# Patient Record
Sex: Male | Born: 1965 | Race: Black or African American | Hispanic: No | Marital: Married | Smoking: Former smoker
Health system: Southern US, Community
[De-identification: ages and names within clinical notes are randomized; demographics above are authoritative.]

## PROBLEM LIST (undated history)

## (undated) DIAGNOSIS — N289 Disorder of kidney and ureter, unspecified: Secondary | ICD-10-CM

## (undated) DIAGNOSIS — M1712 Unilateral primary osteoarthritis, left knee: Secondary | ICD-10-CM

## (undated) DIAGNOSIS — I10 Essential (primary) hypertension: Secondary | ICD-10-CM

## (undated) DIAGNOSIS — M1990 Unspecified osteoarthritis, unspecified site: Secondary | ICD-10-CM

## (undated) HISTORY — DX: Essential (primary) hypertension: I10

## (undated) HISTORY — PX: APPENDECTOMY: SHX54

---

## 1998-09-08 ENCOUNTER — Emergency Department (HOSPITAL_COMMUNITY): Admission: EM | Admit: 1998-09-08 | Discharge: 1998-09-08 | Payer: Self-pay

## 2002-05-21 ENCOUNTER — Encounter: Admission: RE | Admit: 2002-05-21 | Discharge: 2002-05-21 | Payer: Self-pay | Admitting: Internal Medicine

## 2002-05-21 ENCOUNTER — Encounter: Payer: Self-pay | Admitting: Internal Medicine

## 2002-05-22 ENCOUNTER — Encounter: Admission: RE | Admit: 2002-05-22 | Discharge: 2002-05-28 | Payer: Self-pay | Admitting: Internal Medicine

## 2005-08-19 ENCOUNTER — Inpatient Hospital Stay (HOSPITAL_COMMUNITY): Admission: EM | Admit: 2005-08-19 | Discharge: 2005-08-20 | Payer: Self-pay | Admitting: *Deleted

## 2005-08-19 ENCOUNTER — Encounter (INDEPENDENT_AMBULATORY_CARE_PROVIDER_SITE_OTHER): Payer: Self-pay | Admitting: *Deleted

## 2006-11-13 ENCOUNTER — Ambulatory Visit (HOSPITAL_COMMUNITY): Admission: RE | Admit: 2006-11-13 | Discharge: 2006-11-13 | Payer: Self-pay | Admitting: Neurosurgery

## 2007-01-19 ENCOUNTER — Emergency Department (HOSPITAL_COMMUNITY): Admission: EM | Admit: 2007-01-19 | Discharge: 2007-01-20 | Payer: Self-pay | Admitting: Emergency Medicine

## 2007-01-24 ENCOUNTER — Ambulatory Visit (HOSPITAL_COMMUNITY): Admission: RE | Admit: 2007-01-24 | Discharge: 2007-01-25 | Payer: Self-pay | Admitting: Neurosurgery

## 2007-11-29 HISTORY — PX: SHOULDER SURGERY: SHX246

## 2009-12-12 ENCOUNTER — Emergency Department (HOSPITAL_COMMUNITY): Admission: EM | Admit: 2009-12-12 | Discharge: 2009-12-12 | Payer: Self-pay | Admitting: Emergency Medicine

## 2010-02-17 ENCOUNTER — Encounter: Admission: RE | Admit: 2010-02-17 | Discharge: 2010-02-17 | Payer: Self-pay | Admitting: Family Medicine

## 2010-12-19 ENCOUNTER — Encounter: Payer: Self-pay | Admitting: Family Medicine

## 2011-02-13 LAB — BASIC METABOLIC PANEL
BUN: 13 mg/dL (ref 6–23)
CO2: 27 mEq/L (ref 19–32)
Calcium: 8.7 mg/dL (ref 8.4–10.5)
Chloride: 104 mEq/L (ref 96–112)
Creatinine, Ser: 1.14 mg/dL (ref 0.4–1.5)
GFR calc Af Amer: >60 mL/min (ref >60)
GFR calc non Af Amer: >60 mL/min (ref >60)
Glucose, Bld: 81 mg/dL (ref 70–99)
Potassium: 3.9 mEq/L (ref 3.5–5.1)
Sodium: 137 mEq/L (ref 135–145)

## 2011-02-13 LAB — DIFFERENTIAL
Basophils Absolute: 0 10*3/uL (ref 0.0–0.1)
Basophils Relative: 0 % (ref 0–1)
Eosinophils Absolute: 0.2 10*3/uL (ref 0.0–0.7)
Eosinophils Relative: 3 % (ref 0–5)
Lymphocytes Relative: 46 % (ref 12–46)
Lymphs Abs: 3.4 10*3/uL (ref 0.7–4.0)
Monocytes Absolute: 0.5 10*3/uL (ref 0.1–1.0)
Monocytes Relative: 7 % (ref 3–12)
Neutro Abs: 3.3 10*3/uL (ref 1.7–7.7)
Neutrophils Relative %: 44 % (ref 43–77)

## 2011-02-13 LAB — CBC
HCT: 40.2 % (ref 39.0–52.0)
Hemoglobin: 13.9 g/dL (ref 13.0–17.0)
MCHC: 34.4 g/dL (ref 30.0–36.0)
MCV: 90.8 fL (ref 78.0–100.0)
Platelets: 208 10*3/uL (ref 150–400)
RBC: 4.43 MIL/uL (ref 4.22–5.81)
RDW: 14.1 % (ref 11.5–15.5)
WBC: 7.4 10*3/uL (ref 4.0–10.5)

## 2011-02-13 LAB — CK TOTAL AND CKMB (NOT AT ARMC)
CK, MB: 3.1 ng/mL (ref 0.3–4.0)
Relative Index: 1 (ref 0.0–2.5)
Total CK: 316 U/L — ABNORMAL HIGH (ref 7–232)

## 2011-02-13 LAB — TROPONIN I: Troponin I: 0.01 ng/mL (ref 0.00–0.06)

## 2011-02-13 LAB — GLUCOSE, CAPILLARY: Glucose-Capillary: 90 mg/dL (ref 70–99)

## 2011-04-15 NOTE — Op Note (Signed)
NAMEFINTAN, Johnathan Gillespie NO.:  [id number]   MEDICAL RECORD NO.:  [id number]          PATIENT TYPE:  OIB   LOCATION:  3172                         FACILITY:  [facility]   PHYSICIAN:  Coletta Memos, M.D.     DATE OF BIRTH:  Aug 29, 1966   DATE OF PROCEDURE:  01/24/2007  DATE OF DISCHARGE:                               OPERATIVE REPORT   PREOPERATIVE DIAGNOSIS:  1. Left C6-C7 displaced disc.  2. Left C7 radiculopathy.   POSTOPERATIVE DIAGNOSIS:  1. Left C6-C7 displaced disc.  2. Left C7 radiculopathy.   PROCEDURE:  1. Anterior cervical decompression C6-C7.  2. Arthrodesis C6-C7 with 8 mm Synthes allograft.  3. Anterior instrumentation with Vector plating and 14 mm screws.   COMPLICATIONS:  None.   SURGEON:  Coletta Memos, M.D.   ASSISTANT:  Clydene Fake, M.D.   INDICATIONS:  Johnathan Gillespie is a 45 year old gentleman who presented in  December with significant pain in the left upper extremity.  A myelogram  and post myelogram CT showed a displaced disc at C6-C7 on the left side.  I, therefore, recommended and he agreed to undergo operative  decompression.   OPERATIVE NOTE:  Johnathan Gillespie was brought to the operating room,  intubated, and placed under a general anesthetic without difficulty.  He  was positioned with his head in a horseshoe headrest neutrally.  His  neck was prepped and draped in a sterile fashion.  I infiltrated 3 mL of  0.5% lidocaine with 1:200,000 epinephrine, starting from the midline  extending to the medial border of the left sternocleidomastoid below the  cricoid cartilage.  I opened the skin with a #10 blade.  I took this  down to the platysma.  I dissected rostrally and caudally in a plane  both above and below the platysma after I opened the platysma with  Metzenbaum scissors.  I then dissected a plane between the  sternocleidomastoid and medial strap muscles.  I went medial to the  omohyoid and the carotid artery, both of which were  retracted laterally.  I placed a spinal needle and this showed I was at C6-C7.  I then  reflected the longus colli muscles bilaterally and placed a self-  retaining retractor.  I opened the disc space at C6-C7 using a #15  blade.  I then, with the microscope in position, used a drill, pituitary  rongeurs, and curets to remove disc material endplate and also  osteophytes.  After thorough decompression of the spinal canal and both  C7 nerve roots, I then irrigated.  I then prepared for arthrodesis.   The arthrodesis was performed after preparing the endplates with a high  speed drill and curet.  I placed an 8 mm Synthes graft into position.  This was done with Dr. Doreen Beam assistance.  The graft fit snugly and  then I turned my attention to the instrumentation.  I removed the  distraction pins which had been placed at C6 and at C7.  I then sized  the plate and placed four screws with Dr. Doreen Beam assistance, first by  drilling and using self-tapping screws,  two in  C6 and two in C7.  X-ray showed the plate, plug and screws to be in good  position.  I then irrigated the wound.  I then closed the wound in a  layered fashion, reapproximating the platysma and subcuticular layer.  Dermabond was used for sterile dressing.           ______________________________  Coletta Memos, M.D.     KC/MEDQ  D:  01/24/2007  T:  01/24/2007  Job:  [id number]

## 2011-04-15 NOTE — Op Note (Signed)
Johnathan Gillespie, TATAR NO.:  [id number]   MEDICAL RECORD NO.:  [id number]          PATIENT TYPE:  INP   LOCATION:  1304                         FACILITY:  [facility]   PHYSICIAN:  Velora Heckler, MD      DATE OF BIRTH:  [birth year]   DATE OF PROCEDURE:  08/19/2005  DATE OF DISCHARGE:                                 OPERATIVE REPORT   PREOPERATIVE DIAGNOSIS:  Acute appendicitis.   POSTOPERATIVE DIAGNOSIS:  Acute appendicitis.   PROCEDURE:  Laparoscopic appendectomy.   SURGEON:  Velora Heckler, M.D.   ANESTHESIA:  General.   ESTIMATED BLOOD LOSS:  Minimal.   PREPARATION:  Betadine.   COMPLICATIONS:  None.   INDICATIONS:  The patient is a 45 year old black male from [place], [place],  who presents with a three-day history of abdominal pain localizing to the  right lower quadrant associated with nausea and vomiting.  The patient was  evaluated by Dr. Jackquline Denmark.  He was referred for surgical consultation to  [facility] Emergency Department.  Laboratory studies showed a white count  of 10.9.  Physical examination was worrisome for acute appendicitis.  A CT  scan of the abdomen and pelvis confirmed acute appendicitis.  The patient is  now brought to the operating room for appendectomy.   DESCRIPTION OF PROCEDURE:  The procedure was done in OR #1 at the [facility].  The patient is brought to the operating room,  placed in supine position on the operating room table. Following  administration of general anesthesia, the patient was prepped and draped in  usual strict aseptic fashion.  After ascertaining that an adequate level of  anesthesia had been obtained, a supraumbilical incision was made in the  midline with a #15 blade. Dissection was carried down to the fascia.  The  fascia is incised in the midline. The peritoneal cavity was entered  cautiously.  A 0 Vicryl pursestring suture was placed in the fascia.  A  Hasson cannula was introduced and  secured with a pursestring suture. Abdomen  was insufflated with carbon dioxide.  Laparoscope was introduced, and the  abdomen was explored.  Operative ports were placed in the right upper  quadrant and left lower quadrant.  Cecum was mobilized.  There was a tense,  distended, indurated appendix.  It was not ruptured.  There is no fluid.  There is no abscess.  Using gentle blunt dissection, the appendix was  dissected away from the terminal ileum.  Appendiceal mesentery was then  divided with the harmonic scalpel.  Dissection was carried down to the base  of the appendix.  Using the Endo-GIA stapler, the base of the appendix and  proximal proximal portion of the cecal wall was resected.  Appendix was  placed into an EndoCatch bag and withdrawn through the left lower quadrant  port without difficulty. Staple line was inspected for hemostasis.  Mesentery was inspected for hemostasis.  Ports were removed under direct  vision, and good hemostasis was noted at all port sites.  Pneumoperitoneum  was released and all ports were removed.  Pursestring  sutures tied securely.  Sites were anesthetized with local  anesthetic.  All wounds were closed with interrupted 4-0 Vicryl subcuticular  sutures.  Wounds were washed and dried and Steri-Strips were applied.  Sterile dressings were applied.  The patient is awakened from anesthesia and  brought to the recovery room in stable condition. The patient tolerated the  procedure well.      Velora Heckler, MD  Electronically Signed     TMG/MEDQ  D:  08/19/2005  T:  08/20/2005  Job:  [id number]   cc:   Renaye Rakers, M.D.  Fax: [phone]

## 2011-04-15 NOTE — Consult Note (Signed)
NAME:  Johnathan Gillespie, SCHREIER NO.:  [id number]   MEDICAL RECORD NO.:  [id number]           PATIENT TYPE:   LOCATION:                               FACILITY:  [facility]   PHYSICIAN:  Velora Heckler, MD      DATE OF BIRTH:  01/12/1966   DATE OF CONSULTATION:  DATE OF DISCHARGE:                                   CONSULTATION   CONSULTING PHYSICIAN:  Velora Heckler, M.D.   REFERRING PHYSICIAN:  Renaye Rakers, M.D.   CHIEF COMPLAINT:  Abdominal pain, rule out acute appendicitis.   HISTORY OF PRESENT ILLNESS:  Johnathan Gillespie is a pleasant, 45 year old,  black male from [place], [place].  He presents with his family to  the emergency department at the recommendation of Dr. Renaye Rakers for  evaluation for possible acute appendicitis.  The patient had a three-day  illness.  He felt that he had a cold.  However, he had diffuse abdominal  pain which gradually localized to the right lower quadrant.  This was  associated with nausea.  The patient was seen by Dr. Parke Simmers and physical exam  was worrisome for acute appendicitis.  He was referred to surgery for  assessment at [facility].   PAST MEDICAL HISTORY:  1.  Status post umbilical herniorrhaphy.  2.  Tonsillectomy as a child.  3.  History of hypertension.   MEDICATIONS:  Diovan.   ALLERGIES:  None known.   SOCIAL HISTORY:  The patient is married.  He is accompanied by his wife.  He  has two children.  He works for [employer].  He smokes a pack of cigarettes a  day.  He drinks alcohol socially.   REVIEW OF SYSTEMS:  A 15-system review without significant other findings.   FAMILY HISTORY:  Noncontributory.   PHYSICAL EXAMINATION:  GENERAL:  A 45 year old, well-developed, well-  nourished, black male in no acute distress.  VITAL SIGNS:  Temp 97.7, pulse 71, respirations 20, blood pressure 145/77.  HEENT:  Shows him to be normocephalic, atraumatic.  Sclerae clear.  Conjunctivae clear.  Pupils equal  and reactive.  Dentition good.  Mucous  membranes moist.  Voice is normal.  NECK:  Supple, nontender without mass.  Thyroid normal without nodularity.  There is no lymphadenopathy.  LUNGS:  Clear to auscultation bilaterally without rales, rhonchi, or wheeze.  CARDIAC:  Shows a regular rate and rhythm without murmur.  Peripheral pulses  are full.  ABDOMEN:  Soft without distention.  There are a few bowel sounds present.  There is tenderness to percussion and palpation in the right lower quadrant.  There is no referred tenderness.  There is mild rebound.  There is guarding.  There is no sign of hernia.  There is a well healed surgical wound at the  umbilicus.  There is no hepatosplenomegaly.  There is no upper abdominal  tenderness.  EXTREMITIES:  Nontender without edema.  NEUROLOGIC:  The patient is alert and oriented without focal deficit.   LABORATORY STUDIES:  White count 10.9 with normal differential, hemoglobin  14.4, platelet count 293,000.  Electrolytes are normal.  Prothrombin time  normal at 13.2.   RADIOGRAPHIC STUDIES:  CT scan abdomen and pelvis, reviewed with Dr.  Pecolia Ades, documenting early acute appendicitis.   IMPRESSION:  Acute appendicitis.   PLAN:  1.  Admission to [facility].  2.  Initiation of intravenous antibiotics.  3.  To operating room for appendectomy.  4.  Routine postoperative care.   I had a length discussion with the patient and his family regarding  appendectomy.  I explained laparoscopic technique versus open technique.  I  believe there is an approximately 90% chance we will be successful with  laparoscopic surgery on [name].  He understands the risks and benefits  of the procedure and wishes to proceed.  We will make arrangements with the  operating room immediately.      Velora Heckler, MD  Electronically Signed     TMG/MEDQ  D:  08/19/2005  T:  08/19/2005  Job:  [id number]   cc:   Renaye Rakers, M.D.  Fax: [phone]    Velora Heckler, MD  Fax: [phone]

## 2014-01-16 ENCOUNTER — Ambulatory Visit: Payer: Self-pay | Admitting: Emergency Medicine

## 2014-01-16 VITALS — BP 124/96 | HR 76 | Temp 98.2°F | Resp 18 | Ht 72.5 in | Wt 243.2 lb

## 2014-01-16 DIAGNOSIS — Z0289 Encounter for other administrative examinations: Secondary | ICD-10-CM

## 2014-01-16 NOTE — Progress Notes (Signed)
[facility] [address], [place] [id number] [phone]  Date:  01/16/2014   Name:  Johnathan CherryKevin L Riddles   DOB:  08-16-1966   MRN:  [id number]  PCP:  No primary provider on file.    Chief Complaint: Employment Physical   History of Present Illness:  Johnathan CherryKevin L Witherington is a 48 y.o. very pleasant male patient who presents with the following:  DOT certification.  On  diovan for HBP   There are no active problems to display for this patient.   Past Medical History  Diagnosis Date  . Hypertension     Past Surgical History  Procedure Laterality Date  . Shoulder surgery  2009    History  Substance Use Topics  . Smoking status: Former Games developermoker  . Smokeless tobacco: Not on file  . Alcohol Use: No    Family History  Problem Relation Age of Onset  . Hypertension Mother   . Hypertension Father     No Known Allergies  Medication list has been reviewed and updated.  No current outpatient prescriptions on file prior to visit.   No current facility-administered medications on file prior to visit.    Review of Systems:  As per HPI, otherwise negative.    Physical Examination: Filed Vitals:   01/16/14 1409  BP: 124/96  Pulse: 76  Temp: 98.2 F (36.8 C)  Resp: 18   Filed Vitals:   01/16/14 1409  Height: 6' 0.5" (1.842 m)  Weight: 243 lb 3.2 oz (110.315 kg)   Body mass index is 32.51 kg/(m^2). Ideal Body Weight: Weight in (lb) to have BMI = 25: 186.5  GEN: WDWN, NAD, Non-toxic, A & O x 3 HEENT: Atraumatic, Normocephalic. Neck supple. No masses, No LAD. Ears and Nose: No external deformity. CV: RRR, No M/G/R. No JVD. No thrill. No extra heart sounds. PULM: CTA B, no wheezes, crackles, rhonchi. No retractions. No resp. distress. No accessory muscle use. ABD: S, NT, ND, +BS. No rebound. No HSM. EXTR: No c/c/e NEURO Normal gait.  PSYCH: Normally interactive. Conversant. Not depressed or anxious appearing.  Calm demeanor.    Assessment  and Plan: DOT certi  Signed,  Phillips OdorJeffery Kaydin Labo, MD

## 2014-03-02 ENCOUNTER — Emergency Department (HOSPITAL_COMMUNITY)
Admission: EM | Admit: 2014-03-02 | Discharge: 2014-03-03 | Disposition: A | Discharge status: Home / Self Care | Payer: No Typology Code available for payment source | Attending: Emergency Medicine | Admitting: Emergency Medicine

## 2014-03-02 DIAGNOSIS — N200 Calculus of kidney: Secondary | ICD-10-CM | POA: Insufficient documentation

## 2014-03-02 DIAGNOSIS — Z87891 Personal history of nicotine dependence: Secondary | ICD-10-CM | POA: Insufficient documentation

## 2014-03-02 DIAGNOSIS — Z791 Long term (current) use of non-steroidal anti-inflammatories (NSAID): Secondary | ICD-10-CM | POA: Insufficient documentation

## 2014-03-02 DIAGNOSIS — N23 Unspecified renal colic: Secondary | ICD-10-CM | POA: Insufficient documentation

## 2014-03-02 DIAGNOSIS — I10 Essential (primary) hypertension: Secondary | ICD-10-CM | POA: Insufficient documentation

## 2014-03-02 DIAGNOSIS — Z79899 Other long term (current) drug therapy: Secondary | ICD-10-CM | POA: Insufficient documentation

## 2014-03-02 DIAGNOSIS — K7689 Other specified diseases of liver: Secondary | ICD-10-CM | POA: Insufficient documentation

## 2014-03-02 DIAGNOSIS — K760 Fatty (change of) liver, not elsewhere classified: Secondary | ICD-10-CM

## 2014-03-02 DIAGNOSIS — N289 Disorder of kidney and ureter, unspecified: Secondary | ICD-10-CM | POA: Insufficient documentation

## 2014-03-02 DIAGNOSIS — N201 Calculus of ureter: Secondary | ICD-10-CM | POA: Insufficient documentation

## 2014-03-02 HISTORY — DX: Disorder of kidney and ureter, unspecified: N28.9

## 2014-03-03 ENCOUNTER — Encounter (HOSPITAL_COMMUNITY): Payer: Self-pay | Admitting: Emergency Medicine

## 2014-03-03 ENCOUNTER — Emergency Department (HOSPITAL_COMMUNITY): Payer: No Typology Code available for payment source

## 2014-03-03 LAB — COMPREHENSIVE METABOLIC PANEL
ALT: 62 U/L — ABNORMAL HIGH (ref 0–53)
AST: 33 U/L (ref 0–37)
Albumin: 4.5 g/dL (ref 3.5–5.2)
Alkaline Phosphatase: 102 U/L (ref 39–117)
BUN: 20 mg/dL (ref 6–23)
CO2: 27 mEq/L (ref 19–32)
Calcium: 10.2 mg/dL (ref 8.4–10.5)
Chloride: 96 mEq/L (ref 96–112)
Creatinine, Ser: 1.54 mg/dL — ABNORMAL HIGH (ref 0.50–1.35)
GFR calc Af Amer: 60 mL/min — ABNORMAL LOW (ref >90)
GFR calc non Af Amer: 52 mL/min — ABNORMAL LOW (ref >90)
Glucose, Bld: 134 mg/dL — ABNORMAL HIGH (ref 70–99)
Potassium: 3.6 mEq/L — ABNORMAL LOW (ref 3.7–5.3)
Sodium: 139 mEq/L (ref 137–147)
Total Bilirubin: 0.6 mg/dL (ref 0.3–1.2)
Total Protein: 8.8 g/dL — ABNORMAL HIGH (ref 6.0–8.3)

## 2014-03-03 LAB — CBC WITH DIFFERENTIAL/PLATELET
Basophils Absolute: 0 10*3/uL (ref 0.0–0.1)
Basophils Relative: 0 % (ref 0–1)
Eosinophils Absolute: 0 10*3/uL (ref 0.0–0.7)
Eosinophils Relative: 0 % (ref 0–5)
HCT: 45.4 % (ref 39.0–52.0)
Hemoglobin: 15.7 g/dL (ref 13.0–17.0)
Lymphocytes Relative: 15 % (ref 12–46)
Lymphs Abs: 1.6 10*3/uL (ref 0.7–4.0)
MCH: 30.7 pg (ref 26.0–34.0)
MCHC: 34.6 g/dL (ref 30.0–36.0)
MCV: 88.8 fL (ref 78.0–100.0)
Monocytes Absolute: 0.4 10*3/uL (ref 0.1–1.0)
Monocytes Relative: 4 % (ref 3–12)
Neutro Abs: 8.5 10*3/uL — ABNORMAL HIGH (ref 1.7–7.7)
Neutrophils Relative %: 81 % — ABNORMAL HIGH (ref 43–77)
Platelets: 248 10*3/uL (ref 150–400)
RBC: 5.11 MIL/uL (ref 4.22–5.81)
RDW: 13.8 % (ref 11.5–15.5)
WBC: 10.6 10*3/uL — ABNORMAL HIGH (ref 4.0–10.5)

## 2014-03-03 LAB — URINALYSIS, ROUTINE W REFLEX MICROSCOPIC
Bilirubin Urine: NEGATIVE
Glucose, UA: NEGATIVE mg/dL
Ketones, ur: NEGATIVE mg/dL
Leukocytes, UA: NEGATIVE
Nitrite: NEGATIVE
Protein, ur: 30 mg/dL — AB
Specific Gravity, Urine: 1.026 (ref 1.005–1.030)
Urobilinogen, UA: 0.2 mg/dL (ref 0.0–1.0)
pH: 6 (ref 5.0–8.0)

## 2014-03-03 LAB — URINE MICROSCOPIC-ADD ON

## 2014-03-03 LAB — LIPASE, BLOOD: Lipase: 27 U/L (ref 11–59)

## 2014-03-03 MED ORDER — SODIUM CHLORIDE 0.9 % IV BOLUS (SEPSIS)
500.0000 mL | Freq: Once | INTRAVENOUS | Status: AC
  Administered 2014-03-03: 500 mL via INTRAVENOUS

## 2014-03-03 MED ORDER — OXYCODONE-ACETAMINOPHEN 5-325 MG PO TABS: 1.0000 | ORAL_TABLET | ORAL | Status: DC | PRN

## 2014-03-03 MED ORDER — SODIUM CHLORIDE 0.9 % IV BOLUS (SEPSIS): 1000.0000 mL | Freq: Once | INTRAVENOUS | Status: DC

## 2014-03-03 MED ORDER — TAMSULOSIN HCL 0.4 MG PO CAPS: 0.4000 mg | ORAL_CAPSULE | Freq: Two times a day (BID) | ORAL | Status: DC

## 2014-03-03 MED ORDER — NAPROXEN 500 MG PO TABS: 500.0000 mg | ORAL_TABLET | Freq: Two times a day (BID) | ORAL | Status: DC

## 2014-03-03 MED ORDER — IOHEXOL 300 MG/ML  SOLN
50.0000 mL | Freq: Once | INTRAMUSCULAR | Status: AC | PRN
  Administered 2014-03-03: 50 mL via ORAL

## 2014-03-03 MED ORDER — ONDANSETRON HCL 4 MG/2ML IJ SOLN
4.0000 mg | Freq: Once | INTRAMUSCULAR | Status: AC
  Administered 2014-03-03: 4 mg via INTRAVENOUS
  Filled 2014-03-03: qty 2

## 2014-03-03 MED ORDER — HYDROMORPHONE HCL PF 1 MG/ML IJ SOLN
1.0000 mg | Freq: Once | INTRAMUSCULAR | Status: AC
  Administered 2014-03-03: 1 mg via INTRAVENOUS
  Filled 2014-03-03: qty 1

## 2014-03-03 MED ORDER — IOHEXOL 300 MG/ML  SOLN
100.0000 mL | Freq: Once | INTRAMUSCULAR | Status: AC | PRN
  Administered 2014-03-03: 100 mL via INTRAVENOUS

## 2014-03-03 MED ORDER — PROMETHAZINE HCL 25 MG PO TABS: 25.0000 mg | ORAL_TABLET | Freq: Four times a day (QID) | ORAL | Status: DC | PRN

## 2014-03-03 NOTE — ED Provider Notes (Signed)
CSN: [id number]     Arrival date & time 03/02/14  2327 History   First MD Initiated Contact with Patient 03/03/14 0252     Chief Complaint  Patient presents with  . Abdominal Pain     (Consider location/radiation/quality/duration/timing/severity/associated sxs/prior Treatment) HPI 48 yo male with hx of kidney stones presents with LLQ Abdominal pain started 730 this evening while sitting down. Pain described as sharp intermittent pain rated at 7/10 currently. Admits to multiple episodes of N/V. Denies diarrhea. Patient admits to eating tilapia approximately 1.5 hours earlier. Patient denies any fever, Chest pain, SOB, Back pain, dysuria, hematuria, or urinary frequency. Denies any testicle pain. PMH significant for HTN. Past Medical History  Diagnosis Date  . Hypertension   . Renal disorder     kidney stones   Past Surgical History  Procedure Laterality Date  . Shoulder surgery  2009   Family History  Problem Relation Age of Onset  . Hypertension Mother   . Hypertension Father    History  Substance Use Topics  . Smoking status: Former Games developermoker  . Smokeless tobacco: Never Used  . Alcohol Use: No    Review of Systems  All other systems reviewed and are negative.      Allergies  Review of patient's allergies indicates no known allergies.  Home Medications   Current Outpatient Rx  Name  Route  Sig  Dispense  Refill  . HYDROCODONE-ACETAMINOPHEN PO   Oral   Take 1 tablet by mouth once.         Marland Kitchen. ibuprofen (ADVIL,MOTRIN) 200 MG tablet   Oral   Take 800 mg by mouth every 6 (six) hours as needed (for pain.).         Marland Kitchen. Omega-3 Fatty Acids (FISH OIL) 1000 MG CAPS   Oral   Take 1,000 mg by mouth daily.         . valsartan-hydrochlorothiazide (DIOVAN HCT) 160-25 MG per tablet   Oral   Take 1 tablet by mouth daily.         . naproxen (NAPROSYN) 500 MG tablet   Oral   Take 1 tablet (500 mg total) by mouth 2 (two) times daily with a meal.   30 tablet   0   .  oxyCODONE-acetaminophen (PERCOCET) 5-325 MG per tablet   Oral   Take 1 tablet by mouth every 4 (four) hours as needed.   20 tablet   0   . promethazine (PHENERGAN) 25 MG tablet   Oral   Take 1 tablet (25 mg total) by mouth every 6 (six) hours as needed for nausea or vomiting.   12 tablet   0   . tamsulosin (FLOMAX) 0.4 MG CAPS capsule   Oral   Take 1 capsule (0.4 mg total) by mouth 2 (two) times daily.   10 capsule   0    BP 121/71  Pulse 88  Temp(Src) 98.3 F (36.8 C) (Oral)  Resp 18  SpO2 95% Physical Exam  Nursing note and vitals reviewed. Constitutional: He is oriented to person, place, and time. He appears well-developed and well-nourished. No distress.  HENT:  Head: Normocephalic and atraumatic.  Eyes: Conjunctivae are normal. No scleral icterus.  Neck: No JVD present. No tracheal deviation present.  Cardiovascular: Normal rate and regular rhythm.  Exam reveals no gallop and no friction rub.   No murmur heard. Pulmonary/Chest: Effort normal and breath sounds normal. No respiratory distress. He has no wheezes. He has no rhonchi. He has no  rales.  Abdominal: Soft. Normal appearance and bowel sounds are normal. He exhibits no distension. There is no hepatosplenomegaly. There is tenderness in the left lower quadrant. There is no rigidity, no rebound, no guarding, no CVA tenderness, no tenderness at McBurney's point and negative Murphy's sign.    Musculoskeletal: Normal range of motion. He exhibits no edema.  Neurological: He is alert and oriented to person, place, and time.  Skin: Skin is warm and dry. He is not diaphoretic.  Psychiatric: He has a normal mood and affect. His behavior is normal.    ED Course  Procedures (including critical care time) Labs Review Labs Reviewed  CBC WITH DIFFERENTIAL - Abnormal; Notable for the following:    WBC 10.6 (*)    Neutrophils Relative % 81 (*)    Neutro Abs 8.5 (*)    All other components within normal limits    COMPREHENSIVE METABOLIC PANEL - Abnormal; Notable for the following:    Potassium 3.6 (*)    Glucose, Bld 134 (*)    Creatinine, Ser 1.54 (*)    Total Protein 8.8 (*)    ALT 62 (*)    GFR calc non Af Amer 52 (*)    GFR calc Af Amer 60 (*)    All other components within normal limits  URINALYSIS, ROUTINE W REFLEX MICROSCOPIC - Abnormal; Notable for the following:    Color, Urine AMBER (*)    APPearance CLOUDY (*)    Hgb urine dipstick LARGE (*)    Protein, ur 30 (*)    All other components within normal limits  URINE MICROSCOPIC-ADD ON  LIPASE, BLOOD   Imaging Review Ct Abdomen Pelvis W Contrast  03/03/2014   CLINICAL DATA:  Left lower quadrant abdominal pain, difficulty urinating. Question stone versus diverticulitis.  EXAM: CT ABDOMEN AND PELVIS WITH CONTRAST  TECHNIQUE: Multidetector CT imaging of the abdomen and pelvis was performed using the standard protocol following bolus administration of intravenous contrast.  CONTRAST:  OMNIPAQUE IOHEXOL 300 MG/ML  SOLN  COMPARISON:  Prior CT from 01/20/2007.  FINDINGS: Atelectasis seen dependently within the visualized lung bases.  Diffuse hypoattenuation of the liver is compatible with steatosis. Gallbladder is within normal limits. The spleen, adrenal glands, and pancreas demonstrate a normal contrast enhanced appearance.  Scattered nonobstructive right renal calculi are present, the largest of which is located in the upper pole and measures 6 mm. Multiple nonobstructive stones are seen within the left kidney as well, measuring up to 3 mm. There is an obstructive 5 mm stone within the mid left ureter (series 4, image 49). There is secondary mild left hydroureteronephrosis.  A new 1.5 cm exophytic hypodense lesion that measures intermediate density (25 Hounsfield units) is seen extending from the interpolar left kidney (series 4, image 61). This finding is new relative to previous study. Additional subcentimeter hypodense lesion within the  lower pole left kidney is too small the characterize, but statistically likely represents a cyst.  Stomach is normal. No evidence of bowel obstruction. Appendix not visualized, however, no inflammatory changes seen about the cecum or within the right lower quadrant to suggest acute appendicitis. No abnormal wall thickening, mucosal enhancement, or inflammatory fat stranding seen about the bowels.  Bladder is normal.  Prostate within normal limits.  No free air or fluid. No enlarged intra-abdominal pelvic lymph nodes. Scattered aorto bi-iliac atherosclerotic calcifications noted.  No acute osseous abnormality. No worrisome lytic or blastic osseous lesions. Sclerotic lesion within the left sacral ala likely represents a  bone island, unchanged.  IMPRESSION: 1. 5 mm obstructive stone within the mid left ureter with secondary mild left hydroureteronephrosis. 2. Additional bilateral nonobstructive nephrolithiasis as above. 3. 1.5 cm intermediate density lesion extending from the interpolar left kidney, new relative to prior CT from 01/20/2007. While this finding may represent a proteinaceous or hemorrhagic cyst, possible renal malignancy could also have this appearance. Further evaluation with MRI, with and without contrast, is recommended on a nonemergent basis. 4. Hepatic steatosis.   Electronically Signed   By: Rise Mu M.D.   On: 03/03/2014 05:26     EKG Interpretation None      MDM   Final diagnoses:  Renal colic on left side  Ureterolithiasis  Nephrolithiasis  Renal lesion  Hepatic steatosis   Patient afebrile. Patient hypertensive on presentation, suspect secondary to pain. BP normalized with pain control.   UA shows large Hgb. No evidence of UTI.  Borderline leukocytosis at 10.6  Elevated Cr compared to prior visit, not consistent with AKI ALT mildly elevated, nonspecific.  Lipase negative, doubt pancreatitis CT exam shows 5mm obstructive stone w/in mid left ureter w/ left  hydroureteronephrosis. Additional bilateral nonobstructing nephrolithiasis noted.  1.5 cm lesion found on left kidney, new from prior CT in 2008. Possible hemorrhagic cyst vs renal malignancy. Non emergent MRI recommended for further evaluation.   Patient reexamined after pain control. Patient abdomen now soft and nontender. Patient states he is feeling much better and states he feels like going home. Plan to treat patient symptomatically for stones and have him follow up with Urology in 2 days for further management and evaluation of Left Renal Lesion seen on CT. Discussed plan for follow up MRI. Patient confirms understanding. Return precautions given for intractable nausea/vomiting, worsening/unbearable pain, or difficulty urinating.  Patient agrees with plan. Discharged in good condition.   Meds given in ED:  Medications  HYDROmorphone (DILAUDID) injection 1 mg (1 mg Intravenous Given 03/03/14 0333)  ondansetron (ZOFRAN) injection 4 mg (4 mg Intravenous Given 03/03/14 0333)  sodium chloride 0.9 % bolus 500 mL (0 mLs Intravenous Stopped 03/03/14 0636)  iohexol (OMNIPAQUE) 300 MG/ML solution 50 mL (50 mLs Oral Contrast Given 03/03/14 0328)  iohexol (OMNIPAQUE) 300 MG/ML solution 100 mL (100 mLs Intravenous Contrast Given 03/03/14 0428)  HYDROmorphone (DILAUDID) injection 1 mg (1 mg Intravenous Given 03/03/14 1610)    Discharge Medication List as of 03/03/2014  7:42 AM    START taking these medications   Details  naproxen (NAPROSYN) 500 MG tablet Take 1 tablet (500 mg total) by mouth 2 (two) times daily with a meal., Starting 03/03/2014, Until Discontinued, Print    oxyCODONE-acetaminophen (PERCOCET) 5-325 MG per tablet Take 1 tablet by mouth every 4 (four) hours as needed., Starting 03/03/2014, Until Discontinued, Print    promethazine (PHENERGAN) 25 MG tablet Take 1 tablet (25 mg total) by mouth every 6 (six) hours as needed for nausea or vomiting., Starting 03/03/2014, Until Discontinued, Print      tamsulosin (FLOMAX) 0.4 MG CAPS capsule Take 1 capsule (0.4 mg total) by mouth 2 (two) times daily., Starting 03/03/2014, Until Discontinued, Print             Allen Norris [place], [place] 03/03/14 [phone]

## 2014-03-03 NOTE — ED Notes (Addendum)
Pt reports left lower abdominal pain that began tonight while sitting still. Pt reports similar pain in the past with kidney stones. Reports difficulty urinating. Pt reports n/v, denies diarrhea. Reports epistaxis with vomiting. Pt a&o x4, skin warm and dry.

## 2014-03-03 NOTE — Discharge Instructions (Signed)
Make follow up appointment with Urology for further management of recurrent kidney stones and incidental renal lesion seen on CT scan. Recommend outpatient MRI for further evaluation of renal lesion. Take medications as directed. Do not drive with prescription pain medications. Return to emergency department if you develop worsening untolerable pain or become unable to urinate.    Diet for Kidney Stones Kidney stones are small, hard masses that form inside your kidneys. They are made up of salts and minerals and often form when high levels build up in the urine. The minerals can then start to build up, crystalize, and stick together to form stones. There are several different types of kidney stones. The following types of stones may be influenced by dietary factors:   Calcium Oxalate Stones. An oxalate is a salt found in certain foods. Within the body, calcium can combine with oxalates to form calcium oxalate stones, which can be excreted in the urine in high amounts. This is the most common type of kidney stone.  Calcium Phosphate Stones. These stones may occur when the pH of the urine becomes too high, or less acidic, from too much calcium being excreted in the urine. The pH is a measure of how acidic or basic a substance is.  Uric Acid Stones. This type of stone occurs when the pH of the urine becomes too low, or very acidic, because substances called purines build up in the urine. Purines are found in animal proteins. When the urine is highly concentrated with acid, uric acid kidney stones can form.  Other risk factors for kidney stones include genetics, environment, and being overweight. Your caregiver may ask you to follow specific diet guidelines based on the type of stone you have to lessen the chances of your body making more kidney stones.  GENERAL GUIDELINES FOR ALL TYPES OF STONES  Drink plenty of fluid. Drink 12 16 cups of fluid a day, drinking mainly water.This is the most important  thing you can do to prevent the formation of future kidney stones.  Maintain a healthy weight. Your caregiver or dietitian can help you determine what a healthy weight is for you. If you are overweight, weight loss may help prevent the formation of future kidney stones.  Eat a diet adequate in animal protein. Too much animal protein can contribute to the formation of stones. Your dietitian can help you determine how much protein you should be eating. Avoid low carbohydrate, high protein diets.  Follow a balanced eating approach. The DASH diet, which stands for "Dietary Approaches to Stop Hypertension," is an effective meal plan for reducing stone formation. This diet is high in fruits, vegetables, dairy, and whole grains and low in animal protein. Ask your caregiver or dietitian for information about the DASH diet. ADDITIONAL DIET GUIDELINES FOR CALCIUM STONES Avoid foods high in salt. This includes table salt, salt seasonings, MSG, soy sauce, cured and processed meats, salted crackers and snack foods, fast food, and canned soups and foods. Ask your caregiver or dietitian for information about reducing sodium in your diet or following the low sodium diet.  Ensure adequate calcium intake. Use the following table for calcium guidelines:  Men 60 years old and younger  1000 mg/day.  Men 9 years old and older  1500 mg/day.  Women 31 48 years old  1000 mg/day.  Women 50 years and older  1500 mg/day. Your dietitian can help you determine if you are getting enough calcium in your diet. Foods that are high in calcium  include dairy products, broccoli, cheese, yogurt, and pudding. If you need to take a calcium supplement, take it only in the form of calcium citrate.  Avoid foods high in oxalate. Be sure that any supplements you take do not contain more than 500 mg of vitamin C. Vitamin C is converted into oxalate in the body. You do not need to avoid fruits and vegetables high in vitamin C.   Grains:  High-fiber or bran cereal, whole-wheat bread, grits, barley, buckwheat, amaranth, pretzels, and fruitcake.  Vegetables: Dried beans, wax beans, dark leafy greens, eggplant, leeks, okra, parsley, rutabaga, tomato paste, watercress, zucchini, and escarole.  Fruit: Dried apricots, red currants, figs, kiwi, and rhubarb.  Meat and Meat Substitutes: Soybeans and foods made from soy (soyburger, miso), dried beans, peanut butter.  Milk: Chocolate milk mixes and soymilk.  Fats and Oils: Nuts (peanuts, almonds, pecans, cashews, hazelnuts) and nut butters, sesame seeds, and tDahini paste.  Condiments/Miscellaneous: Chocolate, carob, marmalade, poppy seeds, instant iced tea, and juice from high-oxalate fruits.  Document Released: 03/11/2011 Document Revised: 05/15/2012 Document Reviewed: 04/30/2012 [facility] Patient Information 2014 [place], [place].  Kidney Stones Kidney stones (urolithiasis) are solid masses that form inside your kidneys. The intense pain is caused by the stone moving through the kidney, ureter, bladder, and urethra (urinary tract). When the stone moves, the ureter starts to spasm around the stone. The stone is usually passed in your pee (urine).  HOME CARE  Drink enough fluids to keep your pee clear or pale yellow. This helps to get the stone out.  Strain all pee through the provided strainer. Do not pee without peeing through the strainer, not even once. If you pee the stone out, catch it in the strainer. The stone may be as small as a grain of salt. Take this to your doctor. This will help your doctor figure out what you can do to try to prevent more kidney stones.  Only take medicine as told by your doctor.  Follow up with your doctor as told.  Get follow-up X-rays as told by your doctor. GET HELP IF: You have pain that gets worse even if you have been taking pain medicine. GET HELP RIGHT AWAY IF:   Your pain does not get better with medicine.  You have a fever or  shaking chills.  Your pain increases and gets worse over 18 hours.  You have new belly (abdominal) pain.  You feel faint or pass out.  You are unable to pee. MAKE SURE YOU:   Understand these instructions.  Will watch your condition.  Will get help right away if you are not doing well or get worse. Document Released: 05/02/2008 Document Revised: 07/17/2013 Document Reviewed: 04/17/2013 [facility]ExitCare Patient Information 2014 [place], [place].

## 2014-03-03 NOTE — ED Notes (Signed)
Blood was already in lab for Lipase.

## 2014-03-04 NOTE — ED Provider Notes (Signed)
Shared service with midlevel provider. I have personally seen and examined the patient, providing direct face to face care, presenting with the chief complaint of flank pain. Physical exam findings include left sided flank pain, CT confirms a 5 mm stone, obstructive. Plan will be to d/c home if pain is in relative control with Urology f/u.. I have reviewed the nursing documentation on past medical history, family history, and social history.   Derwood KaplanAnkit Brealyn Baril, MD 03/04/14 [phone]

## 2017-07-27 ENCOUNTER — Encounter (HOSPITAL_BASED_OUTPATIENT_CLINIC_OR_DEPARTMENT_OTHER): Payer: Self-pay | Admitting: *Deleted

## 2017-08-08 ENCOUNTER — Encounter (HOSPITAL_BASED_OUTPATIENT_CLINIC_OR_DEPARTMENT_OTHER)
Admission: RE | Admit: 2017-08-08 | Discharge: 2017-08-08 | Disposition: A | Discharge status: Home / Self Care | Payer: BLUE CROSS/BLUE SHIELD | Source: Ambulatory Visit | Attending: Orthopedic Surgery | Admitting: Orthopedic Surgery

## 2017-08-08 ENCOUNTER — Other Ambulatory Visit: Payer: Self-pay

## 2017-08-08 DIAGNOSIS — R9431 Abnormal electrocardiogram [ECG] [EKG]: Secondary | ICD-10-CM | POA: Insufficient documentation

## 2017-08-08 DIAGNOSIS — Z01810 Encounter for preprocedural cardiovascular examination: Secondary | ICD-10-CM | POA: Diagnosis not present

## 2017-08-08 DIAGNOSIS — Z01812 Encounter for preprocedural laboratory examination: Secondary | ICD-10-CM | POA: Insufficient documentation

## 2017-08-08 DIAGNOSIS — I10 Essential (primary) hypertension: Secondary | ICD-10-CM | POA: Diagnosis not present

## 2017-08-08 LAB — BASIC METABOLIC PANEL
Anion gap: 8 (ref 5–15)
BUN: 13 mg/dL (ref 6–20)
CO2: 24 mmol/L (ref 22–32)
Calcium: 9.4 mg/dL (ref 8.9–10.3)
Chloride: 106 mmol/L (ref 101–111)
Creatinine, Ser: 1.3 mg/dL — ABNORMAL HIGH (ref 0.61–1.24)
GFR calc Af Amer: >60 mL/min (ref >60)
GFR calc non Af Amer: >60 mL/min (ref >60)
Glucose, Bld: 120 mg/dL — ABNORMAL HIGH (ref 65–99)
Potassium: 3.6 mmol/L (ref 3.5–5.1)
Sodium: 138 mmol/L (ref 135–145)

## 2017-08-08 LAB — SURGICAL PCR SCREEN
MRSA, PCR: NEGATIVE
Staphylococcus aureus: NEGATIVE

## 2017-08-08 NOTE — Progress Notes (Signed)
Dr. Bradley FerrisEllender reviewed EKG, ok to proceed with surgery. Told pt to bring all medications and pack an overnight bag for day of surgery.

## 2017-08-14 ENCOUNTER — Other Ambulatory Visit: Payer: Self-pay | Admitting: Orthopedic Surgery

## 2017-08-17 ENCOUNTER — Encounter (HOSPITAL_BASED_OUTPATIENT_CLINIC_OR_DEPARTMENT_OTHER): Payer: Self-pay | Admitting: Anesthesiology

## 2017-08-17 ENCOUNTER — Ambulatory Visit (HOSPITAL_BASED_OUTPATIENT_CLINIC_OR_DEPARTMENT_OTHER): Payer: BLUE CROSS/BLUE SHIELD | Admitting: Anesthesiology

## 2017-08-17 ENCOUNTER — Ambulatory Visit (HOSPITAL_BASED_OUTPATIENT_CLINIC_OR_DEPARTMENT_OTHER)
Admission: RE | Admit: 2017-08-17 | Discharge: 2017-08-18 | Disposition: A | Discharge status: Home / Self Care | Payer: BLUE CROSS/BLUE SHIELD | Source: Ambulatory Visit | Attending: Orthopedic Surgery | Admitting: Orthopedic Surgery

## 2017-08-17 ENCOUNTER — Ambulatory Visit (HOSPITAL_COMMUNITY): Payer: BLUE CROSS/BLUE SHIELD

## 2017-08-17 ENCOUNTER — Encounter (HOSPITAL_BASED_OUTPATIENT_CLINIC_OR_DEPARTMENT_OTHER)
Admission: RE | Disposition: A | Discharge status: Home / Self Care | Payer: Self-pay | Source: Ambulatory Visit | Attending: Orthopedic Surgery

## 2017-08-17 DIAGNOSIS — I10 Essential (primary) hypertension: Secondary | ICD-10-CM | POA: Insufficient documentation

## 2017-08-17 DIAGNOSIS — M25762 Osteophyte, left knee: Secondary | ICD-10-CM | POA: Diagnosis not present

## 2017-08-17 DIAGNOSIS — Z87891 Personal history of nicotine dependence: Secondary | ICD-10-CM | POA: Diagnosis not present

## 2017-08-17 DIAGNOSIS — M1712 Unilateral primary osteoarthritis, left knee: Secondary | ICD-10-CM

## 2017-08-17 DIAGNOSIS — M1711 Unilateral primary osteoarthritis, right knee: Secondary | ICD-10-CM | POA: Insufficient documentation

## 2017-08-17 DIAGNOSIS — Z96652 Presence of left artificial knee joint: Secondary | ICD-10-CM

## 2017-08-17 HISTORY — DX: Unilateral primary osteoarthritis, left knee: M17.12

## 2017-08-17 HISTORY — DX: Unspecified osteoarthritis, unspecified site: M19.90

## 2017-08-17 HISTORY — PX: PARTIAL KNEE ARTHROPLASTY: SHX2174

## 2017-08-17 SURGERY — ARTHROPLASTY, KNEE, UNICOMPARTMENTAL
Anesthesia: General | Site: Knee | Laterality: Left

## 2017-08-17 MED ORDER — ZOLPIDEM TARTRATE 5 MG PO TABS: 5.0000 mg | ORAL_TABLET | Freq: Every evening | ORAL | Status: DC | PRN

## 2017-08-17 MED ORDER — BUPIVACAINE HCL (PF) 0.5 % IJ SOLN
INTRAMUSCULAR | Status: DC | PRN
  Administered 2017-08-17: 10 mL via INTRA_ARTICULAR

## 2017-08-17 MED ORDER — HYDROMORPHONE HCL 1 MG/ML IJ SOLN
0.2500 mg | INTRAMUSCULAR | Status: DC | PRN
  Administered 2017-08-17 (×2): 0.5 mg via INTRAVENOUS
  Filled 2017-08-17 (×3): qty 0.5

## 2017-08-17 MED ORDER — MIDAZOLAM HCL 2 MG/2ML IJ SOLN
1.0000 mg | INTRAMUSCULAR | Status: DC | PRN
  Administered 2017-08-17 (×2): 2 mg via INTRAVENOUS

## 2017-08-17 MED ORDER — BUPIVACAINE HCL (PF) 0.5 % IJ SOLN
INTRAMUSCULAR | Status: AC
  Filled 2017-08-17: qty 30

## 2017-08-17 MED ORDER — HYDROMORPHONE HCL 1 MG/ML IJ SOLN
0.5000 mg | INTRAMUSCULAR | Status: DC | PRN
  Administered 2017-08-17 – 2017-08-18 (×3): 0.5 mg via INTRAVENOUS

## 2017-08-17 MED ORDER — FENTANYL CITRATE (PF) 100 MCG/2ML IJ SOLN
INTRAMUSCULAR | Status: AC
  Filled 2017-08-17: qty 2

## 2017-08-17 MED ORDER — FENTANYL CITRATE (PF) 100 MCG/2ML IJ SOLN
INTRAMUSCULAR | Status: AC
Start: 2017-08-17 — End: ?
  Filled 2017-08-17: qty 2

## 2017-08-17 MED ORDER — SCOPOLAMINE 1 MG/3DAYS TD PT72: 1.0000 | MEDICATED_PATCH | Freq: Once | TRANSDERMAL | Status: DC | PRN

## 2017-08-17 MED ORDER — DOCUSATE SODIUM 100 MG PO CAPS
100.0000 mg | ORAL_CAPSULE | Freq: Two times a day (BID) | ORAL | Status: DC
  Administered 2017-08-17: 100 mg via ORAL
  Filled 2017-08-17: qty 1

## 2017-08-17 MED ORDER — ONDANSETRON HCL 4 MG/2ML IJ SOLN
INTRAMUSCULAR | Status: AC
  Filled 2017-08-17: qty 2

## 2017-08-17 MED ORDER — METOCLOPRAMIDE HCL 5 MG PO TABS: 5.0000 mg | ORAL_TABLET | Freq: Three times a day (TID) | ORAL | Status: DC | PRN

## 2017-08-17 MED ORDER — OXYCODONE HCL 5 MG PO TABS
5.0000 mg | ORAL_TABLET | ORAL | Status: DC | PRN
  Administered 2017-08-17 (×2): 10 mg via ORAL
  Administered 2017-08-17: 5 mg via ORAL
  Administered 2017-08-18 (×2): 10 mg via ORAL
  Filled 2017-08-17 (×3): qty 2
  Filled 2017-08-17: qty 1
  Filled 2017-08-17: qty 2

## 2017-08-17 MED ORDER — HYDROMORPHONE HCL 1 MG/ML IJ SOLN
INTRAMUSCULAR | Status: AC
  Filled 2017-08-17: qty 0.5

## 2017-08-17 MED ORDER — GLYCOPYRROLATE 0.2 MG/ML IV SOSY
PREFILLED_SYRINGE | INTRAVENOUS | Status: DC | PRN
Start: 2017-08-17 — End: 2017-08-17
  Administered 2017-08-17: .2 mg via INTRAVENOUS

## 2017-08-17 MED ORDER — LACTATED RINGERS IV SOLN
INTRAVENOUS | Status: DC
  Administered 2017-08-17 (×3): via INTRAVENOUS

## 2017-08-17 MED ORDER — SENNA-DOCUSATE SODIUM 8.6-50 MG PO TABS: 2.0000 | ORAL_TABLET | Freq: Every day | ORAL | 1 refills | Status: DC

## 2017-08-17 MED ORDER — OXYCODONE HCL 5 MG/5ML PO SOLN: 5.0000 mg | Freq: Once | ORAL | Status: AC | PRN

## 2017-08-17 MED ORDER — SUCCINYLCHOLINE CHLORIDE 200 MG/10ML IV SOSY
PREFILLED_SYRINGE | INTRAVENOUS | Status: AC
  Filled 2017-08-17: qty 10

## 2017-08-17 MED ORDER — PROMETHAZINE HCL 25 MG/ML IJ SOLN
6.2500 mg | INTRAMUSCULAR | Status: DC | PRN
  Administered 2017-08-17: 12.5 mg via INTRAVENOUS
  Filled 2017-08-17: qty 1

## 2017-08-17 MED ORDER — ROPIVACAINE HCL 5 MG/ML IJ SOLN
INTRAMUSCULAR | Status: DC | PRN
Start: 2017-08-17 — End: 2017-08-17
  Administered 2017-08-17: 20 mL via PERINEURAL

## 2017-08-17 MED ORDER — OXYCODONE-ACETAMINOPHEN 5-325 MG PO TABS: 1.0000 | ORAL_TABLET | ORAL | Status: DC | PRN

## 2017-08-17 MED ORDER — DEXAMETHASONE SODIUM PHOSPHATE 4 MG/ML IJ SOLN
INTRAMUSCULAR | Status: DC | PRN
  Administered 2017-08-17: 10 mg via INTRAVENOUS

## 2017-08-17 MED ORDER — EPHEDRINE SULFATE-NACL 50-0.9 MG/10ML-% IV SOSY
PREFILLED_SYRINGE | INTRAVENOUS | Status: DC | PRN
  Administered 2017-08-17 (×4): 10 mg via INTRAVENOUS

## 2017-08-17 MED ORDER — METOCLOPRAMIDE HCL 5 MG/ML IJ SOLN: 5.0000 mg | Freq: Three times a day (TID) | INTRAMUSCULAR | Status: DC | PRN

## 2017-08-17 MED ORDER — RIVAROXABAN 10 MG PO TABS: 10.0000 mg | ORAL_TABLET | Freq: Every day | ORAL | 0 refills | Status: DC

## 2017-08-17 MED ORDER — PROPOFOL 500 MG/50ML IV EMUL
INTRAVENOUS | Status: AC
  Filled 2017-08-17: qty 50

## 2017-08-17 MED ORDER — CEFAZOLIN SODIUM-DEXTROSE 2-4 GM/100ML-% IV SOLN
2.0000 g | INTRAVENOUS | Status: AC
  Administered 2017-08-17: 2 g via INTRAVENOUS

## 2017-08-17 MED ORDER — CEFAZOLIN SODIUM-DEXTROSE 1-4 GM/50ML-% IV SOLN
1.0000 g | Freq: Four times a day (QID) | INTRAVENOUS | Status: AC
  Administered 2017-08-17 – 2017-08-18 (×3): 1 g via INTRAVENOUS
  Filled 2017-08-17 (×3): qty 50

## 2017-08-17 MED ORDER — LIDOCAINE 2% (20 MG/ML) 5 ML SYRINGE
INTRAMUSCULAR | Status: DC | PRN
  Administered 2017-08-17: 100 mg via INTRAVENOUS

## 2017-08-17 MED ORDER — FENTANYL CITRATE (PF) 100 MCG/2ML IJ SOLN
50.0000 ug | INTRAMUSCULAR | Status: AC | PRN
  Administered 2017-08-17 (×4): 25 ug via INTRAVENOUS
  Administered 2017-08-17 (×2): 50 ug via INTRAVENOUS
  Administered 2017-08-17: 100 ug via INTRAVENOUS
  Administered 2017-08-17: 50 ug via INTRAVENOUS
  Administered 2017-08-17: 100 ug via INTRAVENOUS

## 2017-08-17 MED ORDER — PROPOFOL 10 MG/ML IV BOLUS
INTRAVENOUS | Status: DC | PRN
  Administered 2017-08-17: 200 mg via INTRAVENOUS

## 2017-08-17 MED ORDER — ONDANSETRON HCL 4 MG PO TABS: 4.0000 mg | ORAL_TABLET | Freq: Four times a day (QID) | ORAL | Status: DC | PRN

## 2017-08-17 MED ORDER — LIDOCAINE 2% (20 MG/ML) 5 ML SYRINGE
INTRAMUSCULAR | Status: AC
  Filled 2017-08-17: qty 5

## 2017-08-17 MED ORDER — SENNA 8.6 MG PO TABS
1.0000 | ORAL_TABLET | Freq: Two times a day (BID) | ORAL | Status: DC
  Administered 2017-08-17: 8.6 mg via ORAL
  Filled 2017-08-17: qty 1

## 2017-08-17 MED ORDER — ONDANSETRON HCL 4 MG PO TABS: 4.0000 mg | ORAL_TABLET | Freq: Three times a day (TID) | ORAL | 0 refills | Status: DC | PRN

## 2017-08-17 MED ORDER — OXYCODONE HCL 5 MG PO TABS: 5.0000 mg | ORAL_TABLET | ORAL | 0 refills | Status: DC | PRN

## 2017-08-17 MED ORDER — MIDAZOLAM HCL 2 MG/2ML IJ SOLN
INTRAMUSCULAR | Status: AC
  Filled 2017-08-17: qty 2

## 2017-08-17 MED ORDER — EPHEDRINE 5 MG/ML INJ
INTRAVENOUS | Status: AC
  Filled 2017-08-17: qty 10

## 2017-08-17 MED ORDER — MAGNESIUM CITRATE PO SOLN: 1.0000 | Freq: Once | ORAL | Status: DC | PRN

## 2017-08-17 MED ORDER — MEPERIDINE HCL 25 MG/ML IJ SOLN: 6.2500 mg | INTRAMUSCULAR | Status: DC | PRN

## 2017-08-17 MED ORDER — ONDANSETRON HCL 4 MG/2ML IJ SOLN: 4.0000 mg | Freq: Four times a day (QID) | INTRAMUSCULAR | Status: DC | PRN

## 2017-08-17 MED ORDER — METHOCARBAMOL 1000 MG/10ML IJ SOLN: 500.0000 mg | Freq: Four times a day (QID) | INTRAMUSCULAR | Status: DC | PRN

## 2017-08-17 MED ORDER — OXYCODONE HCL 5 MG PO TABS
ORAL_TABLET | ORAL | Status: AC
  Filled 2017-08-17: qty 1

## 2017-08-17 MED ORDER — OXYCODONE HCL 5 MG PO TABS
5.0000 mg | ORAL_TABLET | Freq: Once | ORAL | Status: AC | PRN
  Administered 2017-08-17: 5 mg via ORAL

## 2017-08-17 MED ORDER — POLYETHYLENE GLYCOL 3350 17 G PO PACK: 17.0000 g | PACK | Freq: Every day | ORAL | Status: DC | PRN

## 2017-08-17 MED ORDER — SODIUM CHLORIDE 0.9 % IV SOLN
INTRAVENOUS | Status: DC
  Administered 2017-08-17: 12:00:00 via INTRAVENOUS

## 2017-08-17 MED ORDER — BACLOFEN 10 MG PO TABS: 10.0000 mg | ORAL_TABLET | Freq: Three times a day (TID) | ORAL | 0 refills | Status: DC

## 2017-08-17 MED ORDER — DEXAMETHASONE SODIUM PHOSPHATE 10 MG/ML IJ SOLN
INTRAMUSCULAR | Status: AC
  Filled 2017-08-17: qty 1

## 2017-08-17 MED ORDER — BISACODYL 10 MG RE SUPP: 10.0000 mg | Freq: Every day | RECTAL | Status: DC | PRN

## 2017-08-17 MED ORDER — CEFAZOLIN SODIUM-DEXTROSE 2-4 GM/100ML-% IV SOLN
INTRAVENOUS | Status: AC
  Filled 2017-08-17: qty 100

## 2017-08-17 MED ORDER — METHOCARBAMOL 500 MG PO TABS
500.0000 mg | ORAL_TABLET | Freq: Four times a day (QID) | ORAL | Status: DC | PRN
  Administered 2017-08-17 – 2017-08-18 (×3): 500 mg via ORAL
  Filled 2017-08-17 (×3): qty 1

## 2017-08-17 MED ORDER — VALSARTAN-HYDROCHLOROTHIAZIDE 160-25 MG PO TABS
1.0000 | ORAL_TABLET | Freq: Every day | ORAL | Status: DC
Start: 2017-08-17 — End: 2017-08-18

## 2017-08-17 MED ORDER — PHENYLEPHRINE HCL 10 MG/ML IJ SOLN
INTRAMUSCULAR | Status: DC | PRN
  Administered 2017-08-17: 80 ug via INTRAVENOUS

## 2017-08-17 SURGICAL SUPPLY — 72 items
BANDAGE ACE 6X5 VEL STRL LF (GAUZE/BANDAGES/DRESSINGS) ×2 IMPLANT
BANDAGE ESMARK 6X9 LF (GAUZE/BANDAGES/DRESSINGS) ×1 IMPLANT
BEARING TIBIAL OXFORD MED 4 (Orthopedic Implant) ×1 IMPLANT
BIT DRILL QUICK REL 1/8 2PK SL (DRILL) IMPLANT
BLADE SURG 10 STRL SS (BLADE) ×3 IMPLANT
BLADE SURG 15 STRL LF DISP TIS (BLADE) ×2 IMPLANT
BLADE SURG 15 STRL SS (BLADE) ×4
BNDG CMPR 9X6 STRL LF SNTH (GAUZE/BANDAGES/DRESSINGS) ×1
BNDG ESMARK 6X9 LF (GAUZE/BANDAGES/DRESSINGS) ×2
BOWL SMART MIX CTS (DISPOSABLE) ×2 IMPLANT
BRNG TIB MED 4 PHS 3 LT MEN (Orthopedic Implant) ×1 IMPLANT
CANISTER SUCT 1200ML W/VALVE (MISCELLANEOUS) IMPLANT
CANISTER SUCTION 2500CC (MISCELLANEOUS) ×1 IMPLANT
CAPT KNEE PARTIAL 2 ×1 IMPLANT
CEMENT HV SMART SET (Cement) ×2 IMPLANT
CLSR STERI-STRIP ANTIMIC 1/2X4 (GAUZE/BANDAGES/DRESSINGS) ×2 IMPLANT
COVER BACK TABLE 60X90IN (DRAPES) ×2 IMPLANT
CUFF TOURNIQUET SINGLE 34IN LL (TOURNIQUET CUFF) IMPLANT
CUFF TOURNIQUET SINGLE 44IN (TOURNIQUET CUFF) ×1 IMPLANT
DECANTER SPIKE VIAL GLASS SM (MISCELLANEOUS) ×1 IMPLANT
DRAPE EXTREMITY T 121X128X90 (DRAPE) ×2 IMPLANT
DRAPE IMP U-DRAPE 54X76 (DRAPES) ×2 IMPLANT
DRAPE U-SHAPE 47X51 STRL (DRAPES) ×2 IMPLANT
DRILL QUICK RELEASE 1/8 INCH (DRILL)
DRSG MEPILEX BORDER 4X8 (GAUZE/BANDAGES/DRESSINGS) ×1 IMPLANT
DRSG PAD ABDOMINAL 8X10 ST (GAUZE/BANDAGES/DRESSINGS) ×2 IMPLANT
DURAPREP 26ML APPLICATOR (WOUND CARE) ×2 IMPLANT
ELECT REM PT RETURN 9FT ADLT (ELECTROSURGICAL) ×2
ELECTRODE REM PT RTRN 9FT ADLT (ELECTROSURGICAL) ×1 IMPLANT
FACESHIELD WRAPAROUND (MASK) ×4 IMPLANT
FACESHIELD WRAPAROUND OR TEAM (MASK) ×2 IMPLANT
GAUZE SPONGE 4X4 12PLY STRL (GAUZE/BANDAGES/DRESSINGS) ×2 IMPLANT
GLOVE BIO SURGEON STRL SZ8 (GLOVE) ×2 IMPLANT
GLOVE BIOGEL PI IND STRL 7.0 (GLOVE) IMPLANT
GLOVE BIOGEL PI IND STRL 8 (GLOVE) ×2 IMPLANT
GLOVE BIOGEL PI INDICATOR 7.0 (GLOVE) ×3
GLOVE BIOGEL PI INDICATOR 8 (GLOVE) ×2
GLOVE ECLIPSE 6.5 STRL STRAW (GLOVE) ×3 IMPLANT
GLOVE ORTHO TXT STRL SZ7.5 (GLOVE) ×2 IMPLANT
GOWN STRL REUS W/ TWL LRG LVL3 (GOWN DISPOSABLE) ×1 IMPLANT
GOWN STRL REUS W/ TWL XL LVL3 (GOWN DISPOSABLE) ×2 IMPLANT
GOWN STRL REUS W/TWL LRG LVL3 (GOWN DISPOSABLE) ×4
GOWN STRL REUS W/TWL XL LVL3 (GOWN DISPOSABLE) ×4
HANDPIECE INTERPULSE COAX TIP (DISPOSABLE) ×2
IMMOBILIZER KNEE 22 UNIV (SOFTGOODS) IMPLANT
IMMOBILIZER KNEE 24 THIGH 36 (MISCELLANEOUS) IMPLANT
IMMOBILIZER KNEE 24 UNIV (MISCELLANEOUS) ×4
KNEE WRAP E Z 3 GEL PACK (MISCELLANEOUS) ×2 IMPLANT
MANIFOLD NEPTUNE II (INSTRUMENTS) ×2 IMPLANT
NS IRRIG 1000ML POUR BTL (IV SOLUTION) ×2 IMPLANT
PACK ARTHROSCOPY DSU (CUSTOM PROCEDURE TRAY) ×2 IMPLANT
PACK BASIN DAY SURGERY FS (CUSTOM PROCEDURE TRAY) ×2 IMPLANT
PACK BLADE SAW RECIP 70 3 PT (BLADE) ×2 IMPLANT
PENCIL BUTTON HOLSTER BLD 10FT (ELECTRODE) ×2 IMPLANT
SET HNDPC FAN SPRY TIP SCT (DISPOSABLE) ×1 IMPLANT
SHEET MEDIUM DRAPE 40X70 STRL (DRAPES) ×2 IMPLANT
SLEEVE SCD COMPRESS KNEE MED (MISCELLANEOUS) ×2 IMPLANT
SPONGE LAP 18X18 X RAY DECT (DISPOSABLE) ×2 IMPLANT
SUCTION FRAZIER HANDLE 10FR (MISCELLANEOUS) ×1
SUCTION TUBE FRAZIER 10FR DISP (MISCELLANEOUS) ×1 IMPLANT
SUT MNCRL AB 4-0 PS2 18 (SUTURE) IMPLANT
SUT VIC AB 0 CT1 27 (SUTURE) ×4
SUT VIC AB 0 CT1 27XBRD ANBCTR (SUTURE) ×1 IMPLANT
SUT VIC AB 2-0 SH 27 (SUTURE)
SUT VIC AB 2-0 SH 27XBRD (SUTURE) ×1 IMPLANT
SUT VICRYL 3-0 CR8 SH (SUTURE) ×2 IMPLANT
SUT VICRYL 4-0 PS2 18IN ABS (SUTURE) IMPLANT
SYR BULB IRRIGATION 50ML (SYRINGE) ×2 IMPLANT
TOWEL OR 17X24 6PK STRL BLUE (TOWEL DISPOSABLE) ×2 IMPLANT
TOWEL OR NON WOVEN STRL DISP B (DISPOSABLE) ×4 IMPLANT
TUBE CONNECTING 20X1/4 (TUBING) IMPLANT
YANKAUER SUCT BULB TIP NO VENT (SUCTIONS) ×2 IMPLANT

## 2017-08-17 NOTE — Anesthesia Preprocedure Evaluation (Signed)
Anesthesia Evaluation  Patient identified by MRN, date of birth, ID band Patient awake    Reviewed: Allergy & Precautions, NPO status , Patient's Chart, lab work & pertinent test results  Airway Mallampati: II  TM Distance: >3 FB Neck ROM: Full    Dental no notable dental hx.    Pulmonary neg pulmonary ROS, former smoker,    Pulmonary exam normal breath sounds clear to auscultation       Cardiovascular hypertension, Pt. on medications negative cardio ROS Normal cardiovascular exam Rhythm:Regular Rate:Normal     Neuro/Psych negative neurological ROS  negative psych ROS   GI/Hepatic negative GI ROS, Neg liver ROS,   Endo/Other  negative endocrine ROS  Renal/GU negative Renal ROS  negative genitourinary   Musculoskeletal negative musculoskeletal ROS (+) Arthritis ,   Abdominal   Peds negative pediatric ROS (+)  Hematology negative hematology ROS (+)   Anesthesia Other Findings   Reproductive/Obstetrics negative OB ROS                             Anesthesia Physical Anesthesia Plan  ASA: II  Anesthesia Plan: General   Post-op Pain Management: GA combined w/ Regional for post-op pain   Induction: Intravenous  PONV Risk Score and Plan: 2 and Ondansetron and Midazolam  Airway Management Planned: LMA  Additional Equipment:   Intra-op Plan:   Post-operative Plan: Extubation in OR  Informed Consent: I have reviewed the patients History and Physical, chart, labs and discussed the procedure including the risks, benefits and alternatives for the proposed anesthesia with the patient or authorized representative who has indicated his/her understanding and acceptance.   Dental advisory given  Plan Discussed with: CRNA  Anesthesia Plan Comments:         Anesthesia Quick Evaluation

## 2017-08-17 NOTE — Discharge Instructions (Signed)
INSTRUCTIONS AFTER JOINT REPLACEMENT  ° °o Remove items at home which could result in a fall. This includes throw rugs or furniture in walking pathways °o ICE to the affected joint every three hours while awake for 30 minutes at a time, for at least the first 3-5 days, and then as needed for pain and swelling.  Continue to use ice for pain and swelling. You may notice swelling that will progress down to the foot and ankle.  This is normal after surgery.  Elevate your leg when you are not up walking on it.   °o Continue to use the breathing machine you got in the hospital (incentive spirometer) which will help keep your temperature down.  It is common for your temperature to cycle up and down following surgery, especially at night when you are not up moving around and exerting yourself.  The breathing machine keeps your lungs expanded and your temperature down. ° ° °DIET:  As you were doing prior to hospitalization, we recommend a well-balanced diet. ° °DRESSING / WOUND CARE / SHOWERING ° °You may change your dressing 3-5 days after surgery.  Then change the dressing every day with sterile gauze.  Please use good hand washing techniques before changing the dressing.  Do not use any lotions or creams on the incision until instructed by your surgeon. ° °ACTIVITY ° °o Increase activity slowly as tolerated, but follow the weight bearing instructions below.   °o No driving for 6 weeks or until further direction given by your physician.  You cannot drive while taking narcotics.  °o No lifting or carrying greater than 10 lbs. until further directed by your surgeon. °o Avoid periods of inactivity such as sitting longer than an hour when not asleep. This helps prevent blood clots.  °o You may return to work once you are authorized by your doctor.  ° ° ° °WEIGHT BEARING  ° °Weight bearing as tolerated with assist device (walker, cane, etc) as directed, use it as long as suggested by your surgeon or therapist, typically at  least 4-6 weeks. ° ° °EXERCISES ° °Results after joint replacement surgery are often greatly improved when you follow the exercise, range of motion and muscle strengthening exercises prescribed by your doctor. Safety measures are also important to protect the joint from further injury. Any time any of these exercises cause you to have increased pain or swelling, decrease what you are doing until you are comfortable again and then slowly increase them. If you have problems or questions, call your caregiver or physical therapist for advice.  ° °Rehabilitation is important following a joint replacement. After just a few days of immobilization, the muscles of the leg can become weakened and shrink (atrophy).  These exercises are designed to build up the tone and strength of the thigh and leg muscles and to improve motion. Often times heat used for twenty to thirty minutes before working out will loosen up your tissues and help with improving the range of motion but do not use heat for the first two weeks following surgery (sometimes heat can increase post-operative swelling).  ° °These exercises can be done on a training (exercise) mat, on the floor, on a table or on a bed. Use whatever works the best and is most comfortable for you.    Use music or television while you are exercising so that the exercises are a pleasant break in your day. This will make your life better with the exercises acting as a break   in your routine that you can look forward to.   Perform all exercises about fifteen times, three times per day or as directed.  You should exercise both the operative leg and the other leg as well. ° °Exercises include: °  °• Quad Sets - Tighten up the muscle on the front of the thigh (Quad) and hold for 5-10 seconds.   °• Straight Leg Raises - With your knee straight (if you were given a brace, keep it on), lift the leg to 60 degrees, hold for 3 seconds, and slowly lower the leg.  Perform this exercise against  resistance later as your leg gets stronger.  °• Leg Slides: Lying on your back, slowly slide your foot toward your buttocks, bending your knee up off the floor (only go as far as is comfortable). Then slowly slide your foot back down until your leg is flat on the floor again.  °• Angel Wings: Lying on your back spread your legs to the side as far apart as you can without causing discomfort.  °• Hamstring Strength:  Lying on your back, push your heel against the floor with your leg straight by tightening up the muscles of your buttocks.  Repeat, but this time bend your knee to a comfortable angle, and push your heel against the floor.  You may put a pillow under the heel to make it more comfortable if necessary.  ° °A rehabilitation program following joint replacement surgery can speed recovery and prevent re-injury in the future due to weakened muscles. Contact your doctor or a physical therapist for more information on knee rehabilitation.  ° ° °CONSTIPATION ° °Constipation is defined medically as fewer than three stools per week and severe constipation as less than one stool per week.  Even if you have a regular bowel pattern at home, your normal regimen is likely to be disrupted due to multiple reasons following surgery.  Combination of anesthesia, postoperative narcotics, change in appetite and fluid intake all can affect your bowels.  ° °YOU MUST use at least one of the following options; they are listed in order of increasing strength to get the job done.  They are all available over the counter, and you may need to use some, POSSIBLY even all of these options:   ° °Drink plenty of fluids (prune juice may be helpful) and high fiber foods °Colace 100 mg by mouth twice a day  °Senokot for constipation as directed and as needed Dulcolax (bisacodyl), take with full glass of water  °Miralax (polyethylene glycol) once or twice a day as needed. ° °If you have tried all these things and are unable to have a bowel  movement in the first 3-4 days after surgery call either your surgeon or your primary doctor.   ° °If you experience loose stools or diarrhea, hold the medications until you stool forms back up.  If your symptoms do not get better within 1 week or if they get worse, check with your doctor.  If you experience "the worst abdominal pain ever" or develop nausea or vomiting, please contact the office immediately for further recommendations for treatment. ° ° °ITCHING:  If you experience itching with your medications, try taking only a single pain pill, or even half a pain pill at a time.  You can also use Benadryl over the counter for itching or also to help with sleep.  ° °TED HOSE STOCKINGS:  Use stockings on both legs until for at least 2 weeks or as   directed by physician office. They may be removed at night for sleeping.  MEDICATIONS:  See your medication summary on the After Visit Summary that nursing will review with you.  You may have some home medications which will be placed on hold until you complete the course of blood thinner medication.  It is important for you to complete the blood thinner medication as prescribed.  PRECAUTIONS:  If you experience chest pain or shortness of breath - call 911 immediately for transfer to the hospital emergency department.   If you develop a fever greater that 101 F, purulent drainage from wound, increased redness or drainage from wound, foul odor from the wound/dressing, or calf pain - CONTACT YOUR SURGEON.                                                   FOLLOW-UP APPOINTMENTS:  If you do not already have a post-op appointment, please call the office for an appointment to be seen by your surgeon.  Guidelines for how soon to be seen are listed in your After Visit Summary, but are typically between 1-4 weeks after surgery.    MAKE SURE YOU:   Understand these instructions.   Get help right away if you are not doing well or get worse.    Thank you for  letting us be a part of your medical care team.  It is a privilege we respect greatly.  We hope these instructions will help you stay on track for a fast and full recovery!    Regional Anesthesia Blocks  1. Numbness or the inability to move the "blocked" extremity may last from 3-48 hours after placement. The length of time depends on the medication injected and your individual response to the medication. If the numbness is not going away after 48 hours, call your surgeon.  2. The extremity that is blocked will need to be protected until the numbness is gone and the  Strength has returned. Because you cannot feel it, you will need to take extra care to avoid injury. Because it may be weak, you may have difficulty moving it or using it. You may not know what position it is in without looking at it while the block is in effect.  3. For blocks in the legs and feet, returning to weight bearing and walking needs to be done carefully. You will need to wait until the numbness is entirely gone and the strength has returned. You should be able to move your leg and foot normally before you try and bear weight or walk. You will need someone to be with you when you first try to ensure you do not fall and possibly risk injury.  4. Bruising and tenderness at the needle site are common side effects and will resolve in a few days.  5. Persistent numbness or new problems with movement should be communicated to the surgeon or the [facility] [phone] [facility] [phone]).  Post Anesthesia Home Care Instructions  Activity: Get plenty of rest for the remainder of the day. A responsible individual must stay with you for 24 hours following the procedure.  For the next 24 hours, DO NOT: -Drive a car -Advertising copywriter -Drink alcoholic beverages -Take any medication unless instructed by your physician -Make any legal decisions or sign important papers.  Meals: Start with  liquid foods such as gelatin or soup. Progress to regular foods as tolerated. Avoid greasy, spicy, heavy foods. If nausea and/or vomiting occur, drink only clear liquids until the nausea and/or vomiting subsides. Call your physician if vomiting continues.  Special Instructions/Symptoms: Your throat may feel dry or sore from the anesthesia or the breathing tube placed in your throat during surgery. If this causes discomfort, gargle with warm salt water. The discomfort should disappear within 24 hours.  If you had a scopolamine patch placed behind your ear for the management of post- operative nausea and/or vomiting:  1. The medication in the patch is effective for 72 hours, after which it should be removed.  Wrap patch in a tissue and discard in the trash. Wash hands thoroughly with soap and water. 2. You may remove the patch earlier than 72 hours if you experience unpleasant side effects which may include dry mouth, dizziness or visual disturbances. 3. Avoid touching the patch. Wash your hands with soap and water after contact with the patch.

## 2017-08-17 NOTE — Anesthesia Procedure Notes (Signed)
Procedure Name: LMA Insertion Date/Time: 08/17/2017 8:01 AM Performed by: Gar Gibbon Pre-anesthesia Checklist: Patient identified, Emergency Drugs available, Suction available and Patient being monitored Patient Re-evaluated:Patient Re-evaluated prior to induction Oxygen Delivery Method: Circle system utilized Preoxygenation: Pre-oxygenation with 100% oxygen Induction Type: IV induction Ventilation: Mask ventilation without difficulty LMA: LMA inserted LMA Size: 4.0 Number of attempts: 1 Airway Equipment and Method: Bite block Placement Confirmation: positive ETCO2 Tube secured with: Tape Dental Injury: Teeth and Oropharynx as per pre-operative assessment

## 2017-08-17 NOTE — Progress Notes (Signed)
Assisted Dr. Miller with left, ultrasound guided, adductor canal block. Side rails up, monitors on throughout procedure. See vital signs in flow sheet. Tolerated Procedure well.  

## 2017-08-17 NOTE — Transfer of Care (Signed)
Immediate Anesthesia Transfer of Care Note  Patient: Johnathan Gillespie  Procedure(s) Performed: Procedure(s): LEFT UNICOMPARTMENTAL KNEE ARTHROPLASTY (Left)  Patient Location: PACU  Anesthesia Type:GA combined with regional for post-op pain  Level of Consciousness: awake, sedated and responds to stimulation  Airway & Oxygen Therapy: Patient Spontanous Breathing and Patient connected to face mask oxygen  Post-op Assessment: Report given to RN and Post -op Vital signs reviewed and stable  Post vital signs: Reviewed and stable  Last Vitals:  Vitals:   08/17/17 0725 08/17/17 0730  BP:  129/65  Pulse: 70 62  Resp: 15 13  Temp:    SpO2: 95% 95%    Last Pain:  Vitals:   08/17/17 0703  TempSrc: Oral      Patients Stated Pain Goal: 3 (08/17/17 0703)  Complications: No apparent anesthesia complications

## 2017-08-17 NOTE — H&P (Signed)
PREOPERATIVE H&P  Chief Complaint: RIGHT KNEE OSTEOARTHRITIS  HPI: Johnathan Gillespie is a 51 y.o. male who presents for preoperative history and physical with a diagnosis of RIGHT KNEE OSTEOARTHRITIS. Symptoms are rated as moderate to severe, and have been worsening.  This is significantly impairing activities of daily living.  He has elected for surgical management.   He has failed injections, activity modification, anti-inflammatories, and assistive devices.  Preoperative X-rays demonstrate end stage degenerative changes with osteophyte formation, loss of joint space, subchondral sclerosis.   Past Medical History:  Diagnosis Date  . Arthritis   . Hypertension   . Renal disorder    kidney stones   Past Surgical History:  Procedure Laterality Date  . APPENDECTOMY    . SHOULDER SURGERY  2009   Social History   Social History  . Marital status: Married    Spouse name: N/A  . Number of children: N/A  . Years of education: N/A   Social History Main Topics  . Smoking status: Former Smoker    Quit date: 06/28/2017  . Smokeless tobacco: Never Used  . Alcohol use No  . Drug use: No  . Sexual activity: Yes   Other Topics Concern  . None   Social History Narrative  . None   Family History  Problem Relation Age of Onset  . Hypertension Mother   . Hypertension Father    No Known Allergies Prior to Admission medications   Medication Sig Start Date End Date Taking? Authorizing Provider  ibuprofen (ADVIL,MOTRIN) 200 MG tablet Take 800 mg by mouth every 6 (six) hours as needed (for pain.).   Yes [provider]  valsartan-hydrochlorothiazide (DIOVAN HCT) 160-25 MG per tablet Take 1 tablet by mouth daily.   Yes [provider]     Positive ROS: All other systems have been reviewed and were otherwise negative with the exception of those mentioned in the HPI and as above.  Physical Exam: General: Alert, no acute distress Cardiovascular: No pedal  edema Respiratory: No cyanosis, no use of accessory musculature GI: No organomegaly, abdomen is soft and non-tender Skin: No lesions in the area of chief complaint Neurologic: Sensation intact distally Psychiatric: Patient is competent for consent with normal mood and affect Lymphatic: No axillary or cervical lymphadenopathy  MUSCULOSKELETAL: left knee with varus and medial crepitance with pseudolaxity and ROM 0-125.  Assessment: RIGHT KNEE OSTEOARTHRITIS, anteromedial   Plan: Plan for Procedure(s): LEFT UNICOMPARTMENTAL KNEE ARTHROPLASTY  The risks benefits and alternatives were discussed with the patient including but not limited to the risks of nonoperative treatment, versus surgical intervention including infection, bleeding, nerve injury,  blood clots, cardiopulmonary complications, morbidity, mortality, among others, and they were willing to proceed.   Eulas Post, MD Cell [phone]   08/17/2017 7:49 AM

## 2017-08-17 NOTE — Anesthesia Postprocedure Evaluation (Signed)
Anesthesia Post Note  Patient: Johnathan Gillespie  Procedure(s) Performed: Procedure(s) (LRB): LEFT UNICOMPARTMENTAL KNEE ARTHROPLASTY (Left)     Anesthesia Post Evaluation  Last Vitals:  Vitals:   08/17/17 1845 08/17/17 1935  BP: (!) 154/80 (!) 148/83  Pulse: 84 89  Resp: 18 18  Temp:  37.1 C  SpO2: 96% 96%    Last Pain:  Vitals:   08/17/17 1935  TempSrc:   PainSc: 3                  Lowella Curb

## 2017-08-17 NOTE — Op Note (Signed)
08/17/2017  10:06 AM  PATIENT:  Johnathan Gillespie    PRE-OPERATIVE DIAGNOSIS:  RIGHT KNEE OSTEOARTHRITIS  POST-OPERATIVE DIAGNOSIS:  Same  PROCEDURE:  LEFT UNICOMPARTMENTAL KNEE ARTHROPLASTY  SURGEON:  Zykeriah Mathia P, MD  PHYSICIAN ASSISTANT: Brandon Parry, OPA-C, present and scrubbed throughout the case, critical for completion in a timely fashion, and for retraction, instrumentation, and closure.  ANESTHESIA:   General  ESTIMATED BLOOD LOSS: 100 ml  PREOPERATIVE INDICATIONS:  Johnathan Gillespie is a  51 y.o. male with a diagnosis of RIGHT KNEE OSTEOARTHRITIS who failed conservative measures and elected for surgical management.    The risks benefits and alternatives were discussed with the patient preoperatively including but not limited to the risks of infection, bleeding, nerve injury, cardiopulmonary complications, blood clots, the need for revision surgery, among others, and the patient was willing to proceed.  OPERATIVE IMPLANTS: Biomet [place] mobile bearing medial compartment arthroplasty femur size medium, tibia size D, bearing size 4.  OPERATIVE FINDINGS: Endstage grade 4 medial compartment osteoarthritis. The undersurface of his patella was intact, in the deepest portion of the femoral groove there was grade 3 changes on the trochlea, more superiorly the chondral surfaces were intact, the lateral side was completely intact..  The ACL was intact.  Unique aspects of the case: It was fairly difficult to get the sizing guide into the femur around the condyle. He was probably between a size medium and a size large. I had to cut the tibia twice, because with the 2 mm shim, I did not remove enough bone. It was fairly challenging to get the tibial piece out.  The osteotome for the posterior femur did in fact have a fair amount of osteophyte removal, and there was fairly substantial rimming osteophyte medially along the femoral condyle as well.  At the completion of the procedure after  I had placed the 4 mm polyethylene, while examining the tracking, when the knee went past the 120 of flexion the polyethylene tended to lever, and I was concerned about possible posterior impingement. I removed the polyethylene to reassess the back of the femur, and during the removal process dropped the polyethylene onto the floor, which required a second polyethylene. I was however able to remove additional posterior femur using an osteotome, which corrected the polyethylene impingement posteriorly in the deepest range of flexion. At the completion of the case the new polyethylene did not appear to be significantly impinging posteriorly. I'm suspicious there was some residual osteophyte causing the initial problem, that was not removed with the posterior femoral preparation.  OPERATIVE PROCEDURE: The patient was brought to the operating room placed in supine position. General anesthesia was administered. IV antibiotics were given. The lower extremity was placed in the legholder and prepped and draped in usual sterile fashion.  Time out was performed.  The leg was elevated and exsanguinated and the tourniquet was inflated. Anteromedial incision was performed, and I took care to preserve the MCL. Parapatellar incision was carried out, and the osteophytes were excised, along with the medial meniscus and a small portion of the fat pad.  The extra medullary tibial cutting jig was applied, using the spoon and the 59[place]28ma[place]61maMKentucky46maMKentucky31maMKentucky42maMKentucky59maMKentuckyaple Hudson[place] the 2 mm shim, and I took care to protect the anterior cruciate ligament insertion and the tibial spine. The medial collateral ligament was also protected, and I resected my proximal tibia, matching the anatomic slope.   The proximal bony piece was not removed in one piece, and in fact it did not appear  I had an adequate gap, so I then recut the tibia using the 0 shim. I was then able to remove the tibial pieces, and progressed with the procedure.  The intramedullary femoral rod was  placed using the drill, and then using the appropriate reference, I assembled the femoral jig, setting my posterior cutting block. I resected my posterior femur, used the 0 spigot for the anterior femur, and then measured my gap.   I then used the appropriate mill to match the extension gap to the flexion gap. The second milling was at a 4 and then a 5.  The gaps were then measured again with the appropriate feeler gauges. Once I had balanced flexion and extension gaps, I then completed the preparation of the femur.  I milled off the anterior aspect of the distal femur to prevent impingement. I also exposed the tibia, and selected the above-named component, and then used the cutting jig to prepare the keel slot on the tibia. I also used the awl to curette out the bone to complete the preparation of the keel. The back wall was intact.  I then placed trial components, and it was found to have excellent motion, and appropriate balance.  I then cemented the components into place, cementing the tibia first, removing all excess cement, and then cementing the femur.  All loose cement was removed.  The real polyethylene insert was applied manually, and the knee was taken through functional range of motion, as indicated above, there was some liftoff in deep flexion, I removed the polyethylene, removed any posterior osteophytes, and the liftoff was corrected. I then replaced the polyethylene, and will tested the knee, and it was found to have excellent stability and restoration of joint motion, with excellent balance.  The wounds were irrigated copiously, and the parapatellar tissue closed with Vicryl, followed by Vicryl for the subcutaneous tissue, with routine closure with Steri-Strips and sterile gauze.  The tourniquet was released, and the patient was awakened and extubated and returned to PACU in stable and satisfactory condition. There were no complications.

## 2017-08-17 NOTE — Anesthesia Procedure Notes (Signed)
Anesthesia Regional Block: Adductor canal block   Pre-Anesthetic Checklist: ,, timeout performed, Correct Patient, Correct Site, Correct Laterality, Correct Procedure, Correct Position, site marked, Risks and benefits discussed,  Surgical consent,  Pre-op evaluation,  At surgeon's request and post-op pain management  Laterality: Left  Prep: chloraprep       Needles:  Injection technique: Single-shot  Needle Type: Stimiplex     Needle Length: 9cm  Needle Gauge: 21     Additional Needles:   Procedures:,,,, ultrasound used (permanent image in chart),,,,  Narrative:  Start time: 08/17/2017 7:20 AM End time: 08/17/2017 7:25 AM Injection made incrementally with aspirations every 5 mL.  Performed by: Personally  Anesthesiologist: Anitra Lauth RAY

## 2017-08-18 ENCOUNTER — Encounter (HOSPITAL_BASED_OUTPATIENT_CLINIC_OR_DEPARTMENT_OTHER): Payer: Self-pay | Admitting: Orthopedic Surgery

## 2017-08-18 DIAGNOSIS — M1711 Unilateral primary osteoarthritis, right knee: Secondary | ICD-10-CM | POA: Diagnosis not present

## 2019-01-10 DIAGNOSIS — I10 Essential (primary) hypertension: Secondary | ICD-10-CM | POA: Diagnosis not present

## 2019-02-21 DIAGNOSIS — I10 Essential (primary) hypertension: Secondary | ICD-10-CM | POA: Diagnosis not present

## 2019-02-21 DIAGNOSIS — E782 Mixed hyperlipidemia: Secondary | ICD-10-CM | POA: Diagnosis not present

## 2019-02-21 DIAGNOSIS — R7309 Other abnormal glucose: Secondary | ICD-10-CM | POA: Diagnosis not present

## 2019-02-22 DIAGNOSIS — I10 Essential (primary) hypertension: Secondary | ICD-10-CM | POA: Diagnosis not present

## 2019-02-27 DIAGNOSIS — I10 Essential (primary) hypertension: Secondary | ICD-10-CM | POA: Diagnosis not present

## 2019-06-13 ENCOUNTER — Encounter: Payer: Self-pay | Admitting: Cardiology

## 2019-06-17 ENCOUNTER — Other Ambulatory Visit: Payer: Self-pay

## 2019-06-17 ENCOUNTER — Encounter: Payer: Self-pay | Admitting: Cardiology

## 2019-06-17 ENCOUNTER — Ambulatory Visit: Payer: Commercial Managed Care - PPO | Admitting: Cardiology

## 2019-06-17 VITALS — BP 137/85 | HR 67 | Temp 98.9°F | Ht 73.0 in | Wt 249.0 lb

## 2019-06-17 DIAGNOSIS — R079 Chest pain, unspecified: Secondary | ICD-10-CM | POA: Diagnosis not present

## 2019-06-17 DIAGNOSIS — R9431 Abnormal electrocardiogram [ECG] [EKG]: Secondary | ICD-10-CM | POA: Diagnosis not present

## 2019-06-17 NOTE — Progress Notes (Deleted)
Patient referred by Lucianne Lei, MD for ***  Subjective:   Johnathan Gillespie, adult    DOB: 24-Nov-1966, 53 y.o.   MRN: [id number]   No chief complaint on file.   *** HPI  53 y.o. *** adult with ***  *** Past Medical History:  Diagnosis Date  . Arthritis   . Hypertension   . Primary localized osteoarthritis of left knee 08/17/2017  . Renal disorder    kidney stones    *** Past Surgical History:  Procedure Laterality Date  . APPENDECTOMY    . PARTIAL KNEE ARTHROPLASTY Left 08/17/2017   Procedure: LEFT UNICOMPARTMENTAL KNEE ARTHROPLASTY;  Surgeon: Marchia Bond, MD;  Location: [facility];  Service: Orthopedics;  Laterality: Left;  . SHOULDER SURGERY  2009    *** Social History   Socioeconomic History  . Marital status: Married    Spouse name: Not on file  . Number of children: 2  . Years of education: Not on file  . Highest education level: Not on file  Occupational History  . Not on file  Social Needs  . Financial resource strain: Not on file  . Food insecurity    Worry: Not on file    Inability: Not on file  . Transportation needs    Medical: Not on file    Non-medical: Not on file  Tobacco Use  . Smoking status: Former Smoker    Quit date: 06/28/2017    Years since quitting: 1.9  . Smokeless tobacco: Never Used  Substance and Sexual Activity  . Alcohol use: No  . Drug use: No  . Sexual activity: Yes  Lifestyle  . Physical activity    Days per week: Not on file    Minutes per session: Not on file  . Stress: Not on file  Relationships  . Social Herbalist on phone: Not on file    Gets together: Not on file    Attends religious service: Not on file    Active member of club or organization: Not on file    Attends meetings of clubs or organizations: Not on file    Relationship status: Not on file  . Intimate partner violence    Fear of current or ex partner: Not on file    Emotionally abused: Not on file    Physically  abused: Not on file    Forced sexual activity: Not on file  Other Topics Concern  . Not on file  Social History Narrative  . Not on file    *** Family History  Problem Relation Age of Onset  . Hypertension Mother   . Hypertension Father     *** Current Outpatient Medications on File Prior to Visit  Medication Sig Dispense Refill  . diltiazem (TIAZAC) 240 MG 24 hr capsule Take 240 mg by mouth daily.    . empagliflozin (JARDIANCE) 25 MG TABS tablet Take 25 mg by mouth daily.    . rosuvastatin (CRESTOR) 10 MG tablet Take 10 mg by mouth daily.    . valsartan-hydrochlorothiazide (DIOVAN HCT) 160-25 MG per tablet Take 1 tablet by mouth daily.     No current facility-administered medications on file prior to visit.     Cardiovascular studies:  ***  *** Recent labs: ***  *** ROS      *** There were no vitals filed for this visit.   There is no height or weight on file to calculate BMI. There were no vitals filed  for this visit.  *** Objective:   Physical Exam        Assessment & Recommendations:   ***  ***   Thank you for referring the patient to us. Please feel free to contact with any questions.  Elder NegusManish J Donyel Nester, MD [facility] Pager: [phone] Office: [phone] If no answer Cell [phone]

## 2019-06-17 NOTE — Progress Notes (Signed)
Patient referred by Renaye RakersBland, Veita, MD for chest pain  Subjective:   Johnathan Gillespie, adult    DOB: 10-23-1966, 53 y.o.   MRN: [id number]   Chief Complaint  Patient presents with  . Chest Pain    chest pain coming and going for about a year  . New Patient (Initial Visit)    HPI  53 y.o. African American mae with hypertension, referred for evaluation of chest pain.  Patient has had episodes of chest pain infrequently, about twice a year. Last episode occurred in April 2020 after dinner. Pain was retrosternal, radiating to back, pressure like, lasted for 30-40 min and resolved on its own.   Patient is quite active at baseline. He is a Naval architecttruck driver, currently not working. He exercises regularly at [employer]. For last three months, [employer] has been on closed, but he remains active with yardwork etc. Chest pain never occurs with exertion.  His BP is well controlled on current antihypertensive therapy.    Past Medical History:  Diagnosis Date  . Arthritis   . Hypertension   . Primary localized osteoarthritis of left knee 08/17/2017  . Renal disorder    kidney stones     Past Surgical History:  Procedure Laterality Date  . APPENDECTOMY    . PARTIAL KNEE ARTHROPLASTY Left 08/17/2017   Procedure: LEFT UNICOMPARTMENTAL KNEE ARTHROPLASTY;  Surgeon: Teryl LucyLandau, Joshua, MD;  Location: [facility];  Service: Orthopedics;  Laterality: Left;  . SHOULDER SURGERY  2009     Social History   Socioeconomic History  . Marital status: Married    Spouse name: Not on file  . Number of children: 2  . Years of education: Not on file  . Highest education level: Not on file  Occupational History  . Not on file  Social Needs  . Financial resource strain: Not on file  . Food insecurity    Worry: Not on file    Inability: Not on file  . Transportation needs    Medical: Not on file    Non-medical: Not on file  Tobacco Use  . Smoking status: Former Smoker    Quit date:  06/28/2017    Years since quitting: 1.9  . Smokeless tobacco: Never Used  Substance and Sexual Activity  . Alcohol use: No  . Drug use: No  . Sexual activity: Yes  Lifestyle  . Physical activity    Days per week: Not on file    Minutes per session: Not on file  . Stress: Not on file  Relationships  . Social Musicianconnections    Talks on phone: Not on file    Gets together: Not on file    Attends religious service: Not on file    Active member of club or organization: Not on file    Attends meetings of clubs or organizations: Not on file    Relationship status: Not on file  . Intimate partner violence    Fear of current or ex partner: Not on file    Emotionally abused: Not on file    Physically abused: Not on file    Forced sexual activity: Not on file  Other Topics Concern  . Not on file  Social History Narrative  . Not on file     Family History  Problem Relation Age of Onset  . Hypertension Mother   . Hypertension Father      Current Outpatient Medications on File Prior to Visit  Medication Sig Dispense Refill  .  diltiazem (TIAZAC) 240 MG 24 hr capsule Take 240 mg by mouth daily.    . empagliflozin (JARDIANCE) 25 MG TABS tablet Take 25 mg by mouth daily.    . rosuvastatin (CRESTOR) 10 MG tablet Take 10 mg by mouth daily.    . valsartan-hydrochlorothiazide (DIOVAN HCT) 160-25 MG per tablet Take 1 tablet by mouth daily.     No current facility-administered medications on file prior to visit.     Cardiovascular studies:  EKG 06/17/2019: Sinus rhythm 68 bpm.  Anterolateral and inferior T wave inversions.  Consider ischemia.     Recent labs: Not available   Review of Systems  Constitution: Negative for decreased appetite, malaise/fatigue, weight gain and weight loss.  HENT: Negative for congestion.   Eyes: Negative for visual disturbance.  Cardiovascular: Positive for chest pain. Negative for dyspnea on exertion, leg swelling, palpitations and syncope.   Respiratory: Negative for cough.   Endocrine: Negative for cold intolerance.  Hematologic/Lymphatic: Does not bruise/bleed easily.  Skin: Negative for itching and rash.  Musculoskeletal: Negative for myalgias.  Gastrointestinal: Negative for abdominal pain, nausea and vomiting.  Genitourinary: Negative for dysuria.  Neurological: Negative for dizziness and weakness.  Psychiatric/Behavioral: The patient is not nervous/anxious.   All other systems reviewed and are negative.       Vitals:   06/17/19 1304  BP: 137/85  Pulse: 67  Temp: 98.9 F (37.2 C)  SpO2: 97%     Body mass index is 32.85 kg/m. Filed Weights   06/17/19 1304  Weight: 249 lb (112.9 kg)     Objective:   Physical Exam  Constitutional: She is oriented to person, place, and time. She appears well-developed and well-nourished. No distress.  Pulmonary/Chest: Effort normal.  Neurological: She is alert and oriented to person, place, and time.  Psychiatric: She has a normal mood and affect.  Nursing note and vitals reviewed.         Assessment & Recommendations:   53 y.o. African American mae with hypertension, referred for evaluation of chest pain.  Chest pain: Appears nonanginal. Given no pain in last 3 months, I will hold off any ishcemia testing at this time. EKG abnormalities are likely due to hypertension. Will obtain echocardiogram.    Thank you for referring the patient to [place]. Please feel free to contact with any questions.  Nigel Mormon, MD [facility] Pager: [phone] Office: [phone] If no answer Cell [phone]

## 2019-06-20 ENCOUNTER — Other Ambulatory Visit: Payer: Self-pay | Admitting: Cardiology

## 2019-06-20 DIAGNOSIS — R9431 Abnormal electrocardiogram [ECG] [EKG]: Secondary | ICD-10-CM

## 2019-07-08 ENCOUNTER — Other Ambulatory Visit: Payer: Self-pay

## 2019-07-08 ENCOUNTER — Ambulatory Visit
Admission: RE | Admit: 2019-07-08 | Discharge: 2019-07-08 | Disposition: A | Discharge status: Home / Self Care | Payer: Commercial Managed Care - PPO | Source: Ambulatory Visit | Attending: Family Medicine | Admitting: Family Medicine

## 2019-07-08 ENCOUNTER — Other Ambulatory Visit: Payer: Self-pay | Admitting: Family Medicine

## 2019-07-08 DIAGNOSIS — J321 Chronic frontal sinusitis: Secondary | ICD-10-CM

## 2019-07-23 ENCOUNTER — Other Ambulatory Visit: Payer: Self-pay

## 2019-07-23 ENCOUNTER — Other Ambulatory Visit: Payer: Commercial Managed Care - PPO

## 2019-07-23 ENCOUNTER — Ambulatory Visit (INDEPENDENT_AMBULATORY_CARE_PROVIDER_SITE_OTHER): Payer: Commercial Managed Care - PPO

## 2019-07-23 DIAGNOSIS — R9431 Abnormal electrocardiogram [ECG] [EKG]: Secondary | ICD-10-CM

## 2019-07-24 NOTE — Progress Notes (Signed)
S/w pt advised him of normal echo

## 2019-07-24 NOTE — Progress Notes (Signed)
Lvm for pt call back.

## 2019-09-25 ENCOUNTER — Ambulatory Visit: Payer: Commercial Managed Care - PPO | Admitting: Cardiology

## 2020-01-28 ENCOUNTER — Ambulatory Visit: Payer: Commercial Managed Care - PPO | Attending: Internal Medicine

## 2020-01-28 DIAGNOSIS — Z23 Encounter for immunization: Secondary | ICD-10-CM | POA: Insufficient documentation

## 2020-01-28 NOTE — Progress Notes (Signed)
   Covid-19 Vaccination Clinic  Name:  Johnathan Gillespie    MRN: [id number] DOB: June 11, 1966  01/28/2020  [name] was observed post Covid-19 immunization for 15 minutes without incident. She was provided with Vaccine Information Sheet and instruction to access the V-Safe system.   [name] was instructed to call 911 with any severe reactions post vaccine: Marland Kitchen Difficulty breathing  . Swelling of face and throat  . A fast heartbeat  . A bad rash all over body  . Dizziness and weakness   Immunizations Administered    Name Date Dose VIS Date Route   Moderna COVID-19 Vaccine 01/28/2020  1:21 PM 0.5 mL 10/29/2019 Intramuscular   Manufacturer: Moderna   Lot: [id number]   NDC: 81856-314-97

## 2020-02-01 IMAGING — CR PARANASAL SINUSES - COMPLETE 3 + VIEW
4 series · 4 of 4 positions shown · non-contrast
Comparison: None.

CLINICAL DATA: Right frontal and maxillary sinus pain and pressure
for the past 2 weeks.

EXAM:
PARANASAL SINUSES - COMPLETE 3 + VIEW

[[person_name] pa]
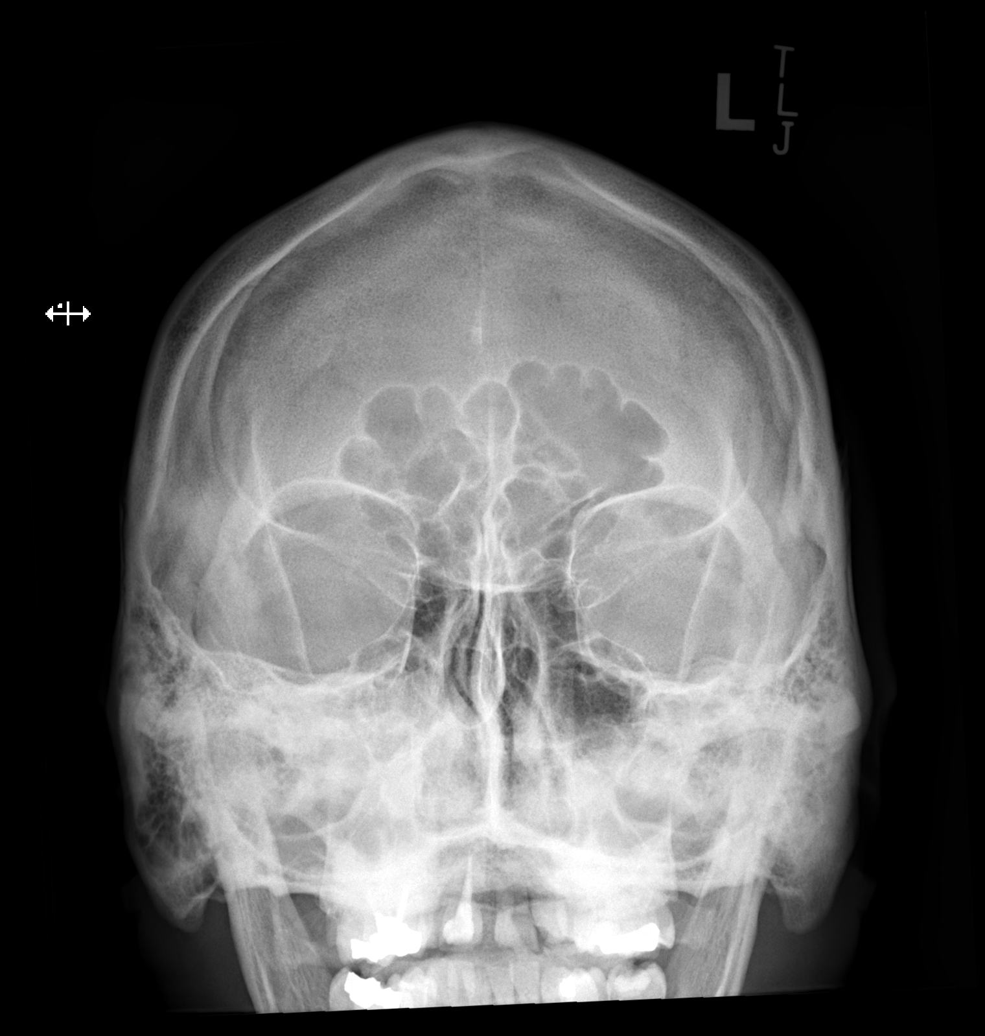

[w waters pa]
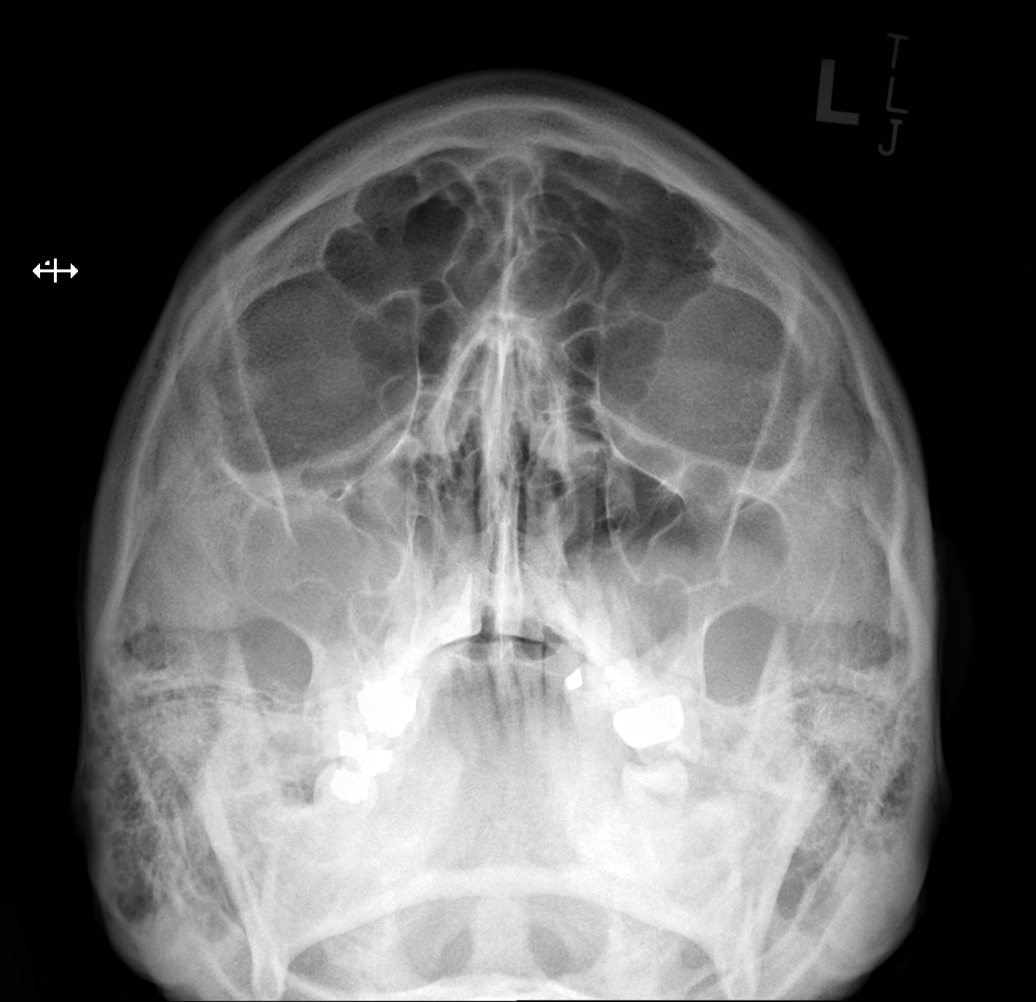

[w skull lat (1 of 2)]
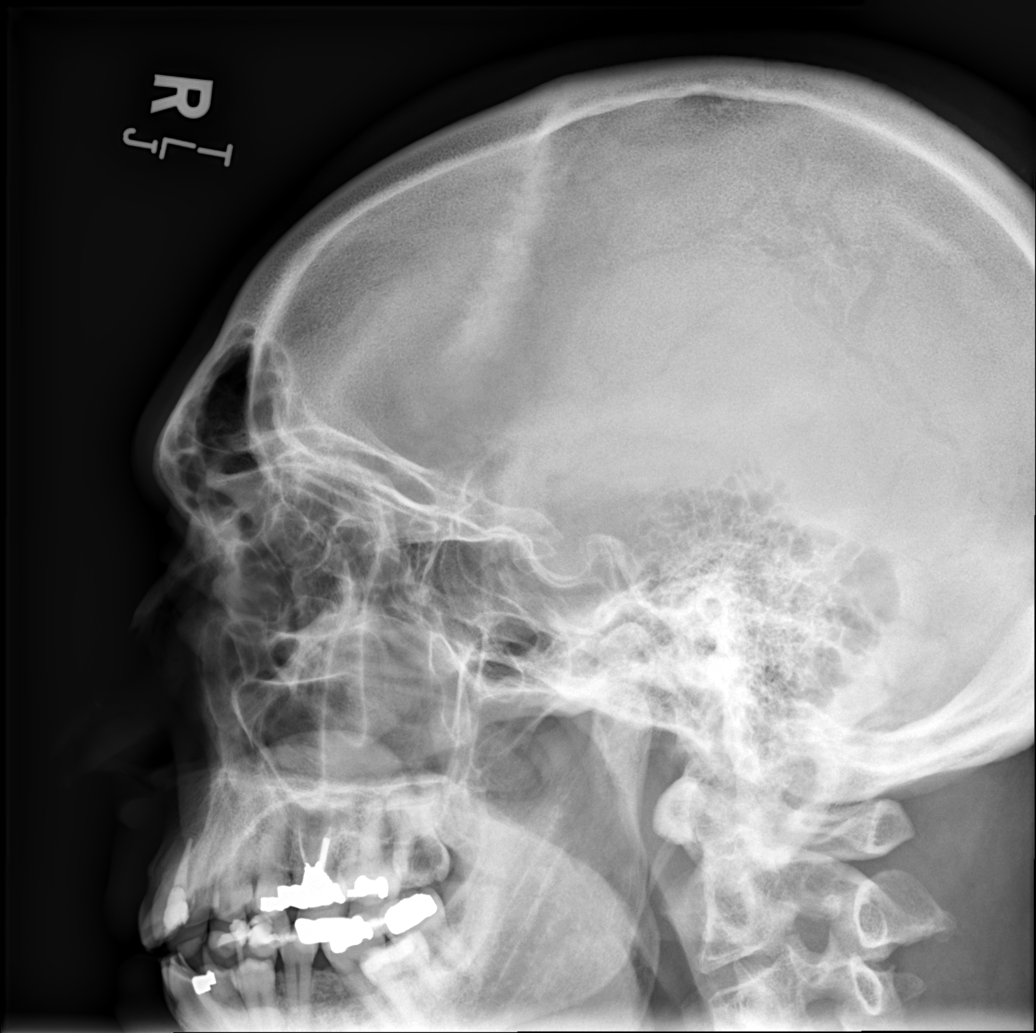

[w skull lat (2 of 2)]
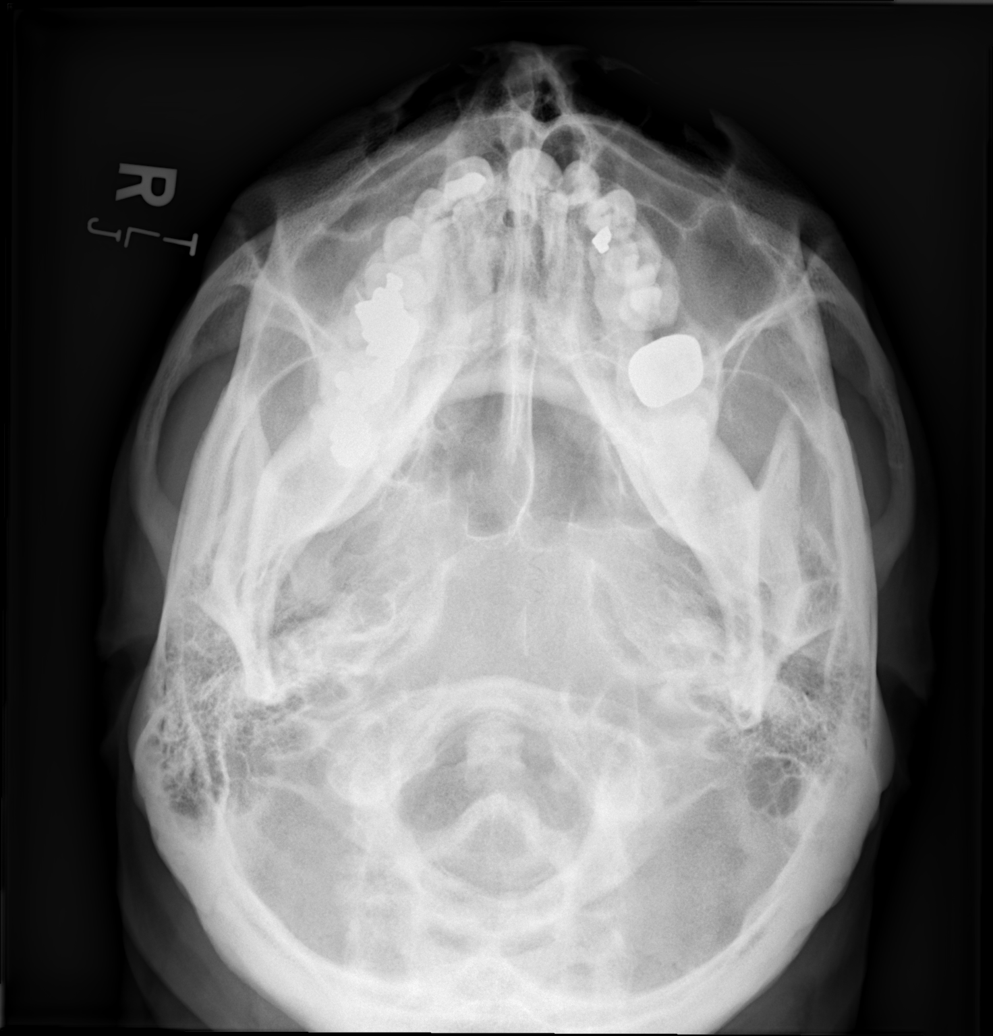

[4 of 4 positions shown; findings below may reference images not displayed]

FINDINGS: Partial opacification of the right maxillary sinus without discrete
air-fluid level. Remaining paranasal sinuses are well aerated. No
significant bone abnormalities are seen.
IMPRESSION: 1. Partial opacification of the right maxillary sinus.

## 2020-02-25 ENCOUNTER — Ambulatory Visit: Payer: Self-pay

## 2020-02-29 ENCOUNTER — Ambulatory Visit: Payer: Self-pay | Attending: Internal Medicine

## 2020-02-29 DIAGNOSIS — Z23 Encounter for immunization: Secondary | ICD-10-CM

## 2020-02-29 NOTE — Progress Notes (Signed)
   Covid-19 Vaccination Clinic  Name:  Johnathan Gillespie    MRN: [id number] DOB: 24-Aug-1966  02/29/2020  Johnathan Gillespie was observed post Covid-19 immunization for 15 minutes without incident. She was provided with Vaccine Information Sheet and instruction to access the V-Safe system.   Johnathan Gillespie was instructed to call 911 with any severe reactions post vaccine: Marland Kitchen Difficulty breathing  . Swelling of face and throat  . A fast heartbeat  . A bad rash all over body  . Dizziness and weakness   Immunizations Administered    Name Date Dose VIS Date Route   Moderna COVID-19 Vaccine 02/29/2020 11:25 AM 0.5 mL 10/29/2019 Intramuscular   Manufacturer: Moderna   Lot: [id number]   NDC: 58006-349-49

## 2020-12-10 ENCOUNTER — Ambulatory Visit: Payer: Self-pay | Attending: Internal Medicine

## 2020-12-10 DIAGNOSIS — Z23 Encounter for immunization: Secondary | ICD-10-CM

## 2020-12-10 NOTE — Progress Notes (Signed)
   Covid-19 Vaccination Clinic  Name:  FABIO WAH    MRN: [id number] DOB: [birth year]  12/10/2020  Mr. Hemmelgarn was observed post Covid-19 immunization for 15 minutes without incident. He was provided with Vaccine Information Sheet and instruction to access the V-Safe system.   Mr. Orourke was instructed to call 911 with any severe reactions post vaccine: Marland Kitchen Difficulty breathing  . Swelling of face and throat  . A fast heartbeat  . A bad rash all over body  . Dizziness and weakness   Immunizations Administered    Name Date Dose VIS Date Route   Moderna Covid-19 Booster Vaccine 12/10/2020  2:11 PM 0.25 mL 09/16/2020 Intramuscular   Manufacturer: Moderna   Lot: [id number]   NDC: 02774-128-78

## 2021-10-04 ENCOUNTER — Ambulatory Visit: Payer: 59 | Admitting: Neurology

## 2021-10-04 ENCOUNTER — Encounter: Payer: Self-pay | Admitting: Neurology

## 2021-10-04 VITALS — BP 153/95 | HR 73 | Ht 73.0 in | Wt 258.0 lb

## 2021-10-04 DIAGNOSIS — G250 Essential tremor: Secondary | ICD-10-CM | POA: Diagnosis not present

## 2021-10-04 NOTE — Progress Notes (Signed)
Chief Complaint  Patient presents with   New Patient (Initial Visit)    Rm 15, alone, here to discuss tremors in both hands, states started 5 years  ago     ASSESSMENT AND PLAN  Johnathan Gillespie is a 55 y.o. male Intermittent bilateral hands tremor  No parkinsonian features, mildly action tremor,  Normal thyroid functional test,  Consistent with essential tremor, no family history,  No limitation in his daily function, no treatment is needed at this point    DIAGNOSTIC DATA (LABS, IMAGING, TESTING) - I reviewed patient records, labs, notes, testing and imaging myself where available.   MEDICAL HISTORY:  Johnathan Gillespie is a 55 years male, seen in request by his primary care physician Dr. Parke Simmers, [facility] Saran, for evaluation of intermittent bilateral upper extremity tremor  I reviewed and summarized the referring note. PMHX HTN HLD DM  Kidney stone  He drives truck, noticed intermittent bilateral hands tremor since 2017, did not affect his daily activity or his job, more of a nuisance for him, he noticed them when he pouring drinks into the cup, signing his name, threading needles sometimes, he denied loss sense of smell, denies gait abnormality,  He only sleep up to 4 to 5 hours every night due to his working schedule, he started working at 3 AM,  He denies family history of tremor  Lab in Oct 2022, LDL 77,  CMP, 1.17,CBC hg 38.2, TSH.  PHYSICAL EXAM:   Vitals:   10/04/21 1257  BP: (!) 153/95  Pulse: 73  Weight: 258 lb (117 kg)  Height: 6\' 1"  (1.854 m)   Not recorded     Body mass index is 34.04 kg/m.  PHYSICAL EXAMNIATION:  Gen: NAD, conversant, well nourised, well groomed                     Cardiovascular: Regular rate rhythm, no peripheral edema, warm, nontender. Eyes: Conjunctivae clear without exudates or hemorrhage Neck: Supple, no carotid bruits. Pulmonary: Clear to auscultation bilaterally   NEUROLOGICAL EXAM:  MENTAL STATUS: Speech:    Speech  is normal; fluent and spontaneous with normal comprehension.  Cognition:     Orientation to time, place and person     Normal recent and remote memory     Normal Attention span and concentration     Normal Language, naming, repeating,spontaneous speech     Fund of knowledge   CRANIAL NERVES: CN II: Visual fields are full to confrontation. Pupils are round equal and briskly reactive to light. CN III, IV, VI: extraocular movement are normal. No ptosis. CN V: Facial sensation is intact to light touch CN VII: Face is symmetric with normal eye closure  CN VIII: Hearing is normal to causal conversation. CN IX, X: Phonation is normal. CN XI: Head turning and shoulder shrug are intact  MOTOR: Mild bilateral hand posturing tremor, no weakness, no rigidity no bradykinesia  REFLEXES: Reflexes are 2+ and symmetric at the biceps, triceps, knees, and ankles. Plantar responses are flexor.  SENSORY: Intact to light touch, pinprick and vibratory sensation are intact in fingers and toes.  COORDINATION: There is no trunk or limb dysmetria noted.  GAIT/STANCE: Posture is normal. Gait is steady with normal steps, base, arm swing, and turning. Heel and toe walking are normal. Tandem gait is normal.  Romberg is absent.  REVIEW OF SYSTEMS:  Full 14 system review of systems performed and notable only for as above All other review of systems were negative.  ALLERGIES: No Known Allergies  HOME MEDICATIONS: Current Outpatient Medications  Medication Sig Dispense Refill   amLODipine (NORVASC) 10 MG tablet Take 10 mg by mouth at bedtime.     diltiazem (TIAZAC) 240 MG 24 hr capsule Take 240 mg by mouth daily.     empagliflozin (JARDIANCE) 25 MG TABS tablet Take 25 mg by mouth daily.     rosuvastatin (CRESTOR) 10 MG tablet Take 10 mg by mouth daily.     valsartan-hydrochlorothiazide (DIOVAN-HCT) 160-25 MG tablet Take 1 tablet by mouth daily.     No current facility-administered medications for this  visit.    PAST MEDICAL HISTORY: Past Medical History:  Diagnosis Date   Arthritis    Hypertension    Primary localized osteoarthritis of left knee 08/17/2017   Renal disorder    kidney stones    PAST SURGICAL HISTORY: Past Surgical History:  Procedure Laterality Date   APPENDECTOMY     PARTIAL KNEE ARTHROPLASTY Left 08/17/2017   Procedure: LEFT UNICOMPARTMENTAL KNEE ARTHROPLASTY;  Surgeon: Teryl Lucy, MD;  Location: [facility];  Service: Orthopedics;  Laterality: Left;   SHOULDER SURGERY  2009    FAMILY HISTORY: Family History  Problem Relation Age of Onset   Hypertension Mother    Hypertension Father     SOCIAL HISTORY: Social History   Socioeconomic History   Marital status: Married    Spouse name: Not on file   Number of children: 2   Years of education: Not on file   Highest education level: Not on file  Occupational History   Not on file  Tobacco Use   Smoking status: Former    Types: Cigarettes    Quit date: 06/28/2017    Years since quitting: 4.2   Smokeless tobacco: Never  Vaping Use   Vaping Use: Never used  Substance and Sexual Activity   Alcohol use: No   Drug use: No   Sexual activity: Yes  Other Topics Concern   Not on file  Social History Narrative   Not on file   Social Determinants of Health   Financial Resource Strain: Not on file  Food Insecurity: Not on file  Transportation Needs: Not on file  Physical Activity: Not on file  Stress: Not on file  Social Connections: Not on file  Intimate Partner Violence: Not on file      Levert Feinstein, M.D. Ph.D.  [facility] [address], [address] [place], [place] [address] Ph: [phone] Fax: [phone]  CC:  Renaye Rakers, MD [address] [place],  [place] [address]  Renaye Rakers, MD
